# Patient Record
Sex: Male | Born: 1950 | Race: Black or African American | Hispanic: No | Smoking: Former smoker
Health system: Southern US, Community
[De-identification: ages and names within clinical notes are randomized; demographics above are authoritative.]

## PROBLEM LIST (undated history)

## (undated) DIAGNOSIS — I10 Essential (primary) hypertension: Secondary | ICD-10-CM

## (undated) DIAGNOSIS — K219 Gastro-esophageal reflux disease without esophagitis: Secondary | ICD-10-CM

## (undated) DIAGNOSIS — G35 Multiple sclerosis: Secondary | ICD-10-CM

## (undated) DIAGNOSIS — E119 Type 2 diabetes mellitus without complications: Secondary | ICD-10-CM

## (undated) DIAGNOSIS — G8222 Paraplegia, incomplete: Secondary | ICD-10-CM

## (undated) DIAGNOSIS — E785 Hyperlipidemia, unspecified: Secondary | ICD-10-CM

## (undated) DIAGNOSIS — R972 Elevated prostate specific antigen [PSA]: Principal | ICD-10-CM

## (undated) HISTORY — DX: Hyperlipidemia, unspecified: E78.5

## (undated) HISTORY — DX: Essential (primary) hypertension: I10

## (undated) HISTORY — DX: Gastro-esophageal reflux disease without esophagitis: K21.9

## (undated) HISTORY — DX: Type 2 diabetes mellitus without complications: E11.9

## (undated) HISTORY — DX: Multiple sclerosis: G35

## (undated) HISTORY — DX: Elevated prostate specific antigen (PSA): R97.20

## (undated) HISTORY — PX: OTHER SURGICAL HISTORY: SHX169

## (undated) HISTORY — DX: Paraplegia, incomplete: G82.22

---

## 2001-04-24 ENCOUNTER — Encounter: Payer: Self-pay | Admitting: Neurology

## 2001-04-24 ENCOUNTER — Ambulatory Visit (HOSPITAL_COMMUNITY): Admission: RE | Admit: 2001-04-24 | Discharge: 2001-04-24 | Payer: Self-pay | Admitting: Neurology

## 2004-08-08 ENCOUNTER — Encounter: Admission: RE | Admit: 2004-08-08 | Discharge: 2004-11-06 | Payer: Self-pay | Admitting: Neurology

## 2004-08-25 ENCOUNTER — Emergency Department (HOSPITAL_COMMUNITY): Admission: EM | Admit: 2004-08-25 | Discharge: 2004-08-25 | Payer: Self-pay | Admitting: Emergency Medicine

## 2006-06-09 ENCOUNTER — Ambulatory Visit: Payer: Self-pay | Admitting: Psychiatry

## 2008-12-07 ENCOUNTER — Inpatient Hospital Stay: Payer: Self-pay | Admitting: *Deleted

## 2010-01-17 ENCOUNTER — Emergency Department: Payer: Self-pay | Admitting: Emergency Medicine

## 2010-11-20 ENCOUNTER — Inpatient Hospital Stay: Payer: Self-pay | Admitting: Internal Medicine

## 2014-03-27 ENCOUNTER — Ambulatory Visit: Payer: Self-pay | Admitting: Internal Medicine

## 2014-07-18 DIAGNOSIS — G8222 Paraplegia, incomplete: Secondary | ICD-10-CM

## 2014-07-18 HISTORY — DX: Paraplegia, incomplete: G82.22

## 2016-01-17 ENCOUNTER — Ambulatory Visit: Payer: Self-pay

## 2016-01-25 ENCOUNTER — Encounter: Payer: Self-pay | Admitting: Urology

## 2016-01-25 ENCOUNTER — Ambulatory Visit (INDEPENDENT_AMBULATORY_CARE_PROVIDER_SITE_OTHER): Payer: Medicare Other | Admitting: Urology

## 2016-01-25 VITALS — BP 177/85 | HR 72 | Ht 76.0 in

## 2016-01-25 DIAGNOSIS — N4281 Prostatodynia syndrome: Secondary | ICD-10-CM

## 2016-01-25 DIAGNOSIS — E119 Type 2 diabetes mellitus without complications: Secondary | ICD-10-CM | POA: Insufficient documentation

## 2016-01-25 DIAGNOSIS — N4289 Other specified disorders of prostate: Secondary | ICD-10-CM

## 2016-01-25 DIAGNOSIS — R972 Elevated prostate specific antigen [PSA]: Secondary | ICD-10-CM

## 2016-01-25 DIAGNOSIS — I10 Essential (primary) hypertension: Secondary | ICD-10-CM

## 2016-01-25 DIAGNOSIS — E785 Hyperlipidemia, unspecified: Secondary | ICD-10-CM | POA: Insufficient documentation

## 2016-01-25 DIAGNOSIS — G35 Multiple sclerosis: Secondary | ICD-10-CM

## 2016-01-25 DIAGNOSIS — K219 Gastro-esophageal reflux disease without esophagitis: Secondary | ICD-10-CM

## 2016-01-25 HISTORY — DX: Elevated prostate specific antigen (PSA): R97.20

## 2016-01-25 HISTORY — DX: Essential (primary) hypertension: I10

## 2016-01-25 HISTORY — DX: Gastro-esophageal reflux disease without esophagitis: K21.9

## 2016-01-25 HISTORY — DX: Type 2 diabetes mellitus without complications: E11.9

## 2016-01-25 HISTORY — DX: Hyperlipidemia, unspecified: E78.5

## 2016-01-25 HISTORY — DX: Multiple sclerosis: G35

## 2016-01-25 LAB — URINALYSIS, COMPLETE
Bilirubin, UA: NEGATIVE
GLUCOSE, UA: NEGATIVE
Ketones, UA: NEGATIVE
LEUKOCYTES UA: NEGATIVE
Nitrite, UA: NEGATIVE
PH UA: 6.5 (ref 5.0–7.5)
Specific Gravity, UA: 1.02 (ref 1.005–1.030)
Urobilinogen, Ur: 0.2 mg/dL (ref 0.2–1.0)

## 2016-01-25 LAB — MICROSCOPIC EXAMINATION: BACTERIA UA: NONE SEEN

## 2016-01-25 NOTE — Patient Instructions (Signed)

## 2016-01-25 NOTE — Progress Notes (Signed)
01/25/2016 11:35 AM   Victor Mcgee [birth year] [id number]  Referring provider: Glendon Axe   Chief Complaint  Patient presents with  . Elevated PSA    New Patient    HPI: 65 year old AAM with MS who presents for workup of elevated PSA.  He had a PSA drawn by his PCP on 01/04/2016 which resulted 8.87 ng/dL. He has no known prior history of elevated PSA. He does have a significant family history of prostate cancer including 2 brothers who were diagnosed and treated in their 63s for prostate cancer.  He denies any significant urinary symptoms other than occasional urge incontinence. He is wheelchair bound and has been for many years related to his multiple sclerosis. Occasionally, he has difficulty getting to the bathroom on time due to transfer issues and mobility. These episodes are fairly rare. He denies any baseline urinary urgency, frequency, or nocturia. His stream is good. He does feel that he is able to empty his bladder completely. No UTIs or gross hematuria.  No weight loss or bone pain.  He presents to clinic today accompanied by his son.    PMH: Past Medical History  Diagnosis Date  . BP (high blood pressure) 01/25/2016  . Acid reflux 01/25/2016  . Controlled type 2 diabetes mellitus without complication ([name]) 99991111  . Incomplete paraplegia ([name]) 07/18/2014  . Multiple sclerosis ([name]) 01/25/2016  . Elevated prostate specific antigen (PSA) 01/25/2016  . HLD (hyperlipidemia) 01/25/2016    Surgical History: Past Surgical History  Procedure Laterality Date  . None      Home Medications:    Medication List       This list is accurate as of: 01/25/16 11:35 AM.  Always use your most recent med list.               baclofen 10 MG tablet  Commonly known as:  LIORESAL  Take by mouth.     hydrochlorothiazide 12.5 MG capsule  Commonly known as:  MICROZIDE  Take by mouth.     lisinopril 10 MG tablet  Commonly known as:  PRINIVIL,ZESTRIL  Take by mouth.     memantine 10 MG tablet  Commonly known as:  NAMENDA  Take 1 tablet by mouth two  times daily     metoprolol 50 MG tablet  Commonly known as:  LOPRESSOR  Take by mouth.     Transport Chair Misc  One power wheelchair for multiple sclerosis with leg weakness.     ZETIA 10 MG tablet  Generic drug:  ezetimibe  Take by mouth.        Allergies: No Known Allergies  Family History: Family History  Problem Relation Age of Onset  . Kidney failure Mother   . Kidney cancer Brother   . Prostate cancer Brother     Social History:  reports that he has quit smoking. He does not have any smokeless tobacco history on file. He reports that he does not drink alcohol or use illicit drugs.  ROS: UROLOGY Frequent Urination?: No Hard to postpone urination?: No Burning/pain with urination?: No Get up at night to urinate?: No Leakage of urine?: No Urine stream starts and stops?: No Trouble starting stream?: No Do you have to strain to urinate?: No Blood in urine?: No Urinary tract infection?: No Sexually transmitted disease?: No Injury to kidneys or bladder?: No Painful intercourse?: No Weak stream?: No Erection problems?: No Penile pain?: No  Gastrointestinal Nausea?: No Vomiting?: No Indigestion/heartburn?: No Diarrhea?: No Constipation?: No  Constitutional  Fever: No Night sweats?: No Weight loss?: No Fatigue?: No  Skin Skin rash/lesions?: No Itching?: No  Eyes Blurred vision?: No Double vision?: No  Ears/Nose/Throat Sore throat?: No Sinus problems?: No  Hematologic/Lymphatic Swollen glands?: No Easy bruising?: No  Cardiovascular Leg swelling?: No Chest pain?: No  Respiratory Cough?: No Shortness of breath?: No  Endocrine Excessive thirst?: No  Musculoskeletal Back pain?: No Joint pain?: No  Neurological Headaches?: No Dizziness?: No  Psychologic Depression?: No Anxiety?: No  Physical Exam: BP 177/85 mmHg  Pulse 72  Ht 6\' 4"  (1.93 m)    Constitutional:  Alert and oriented, No acute distress. HEENT: [place] AT, moist mucus membranes.  Trachea midline, no masses. Cardiovascular: No clubbing, cyanosis, or edema. Respiratory: Normal respiratory effort, no increased work of breathing. GI: Abdomen is soft, nontender, nondistended, no abdominal masses GU: No CVA tenderness.  Rectal: Slightly decreased sphincter tone. No hemorrhoids. Enlarged 50+ g prostate with slight asymmetry, right greater than left with rubbery lateral lobe. No nodules. Skin: No rashes, bruises or suspicious lesions. Lymph: No cervical or inguinal adenopathy. Neurologic: Lower extremity weakness, able to transfer and stand with support. Psychiatric: Normal mood and affect.  Laboratory Data: PSA, Total (Screen)01/04/2016  [name]  Component Name Value Range  PSA (Prostate Specific Antigen), Total 8.87 (H) 0.10 - 4.00 ng/mL   Cr 1.3  AST 105  ALT 50  A1c 5.7  Hgb 14.3   Urinalysis Results for orders placed or performed in visit on 01/25/16  Microscopic Examination  Result Value Ref Range   WBC, UA 0-5 0 -  5 /hpf   RBC, UA 0-2 0 -  2 /hpf   Epithelial Cells (non renal) 0-10 0 - 10 /hpf   Bacteria, UA None seen None seen/Few  Urinalysis, Complete  Result Value Ref Range   Specific Gravity, UA 1.020 1.005 - 1.030   pH, UA 6.5 5.0 - 7.5   Color, UA Yellow Yellow   Appearance Ur Clear Clear   Leukocytes, UA Negative Negative   Protein, UA 2+ (A) Negative/Trace   Glucose, UA Negative Negative   Ketones, UA Negative Negative   RBC, UA Trace (A) Negative   Bilirubin, UA Negative Negative   Urobilinogen, Ur 0.2 0.2 - 1.0 mg/dL   Nitrite, UA Negative Negative   Microscopic Examination See below:   PSA  Result Value Ref Range   Prostate Specific Ag, Serum 10.4 (H) 0.0 - 4.0 ng/mL     Pertinent Imaging: n/a  Assessment & Plan:    1. Elevated prostate specific antigen (PSA) Elevated PSA, repeat 10.4 with asymmetric  prostate, strong family history of prostate cancer.  He does have medical comorbidities including MS but he is a relatively slow progressor.        We reviewed the implications of an elevated PSA and the uncertainty surrounding it. In general, a man's PSA increases with age and is produced by both normal and cancerous prostate tissue. Differential for elevated PSA is BPH, prostate cancer, infection, recent intercourse/ejaculation, prostate infarction, recent urethroscopic manipulation (foley placement/cystoscopy) and prostatitis. Management of an elevated PSA can include observation or prostate biopsy and wediscussed this in detail. We discussed that indications for prostate biopsy are defined by age and race specific PSA cutoffs as well as a PSA velocity of 0.75/year.  We discussed prostate biopsy in detail including the procedure itself, the risks of blood in the urine, stool, and ejaculate, serious infection, and discomfort.   Given above findings, recommend proceeding  with prostate biopsy.  - Urinalysis, Complete - PSA  2. Prostate asymmetry As above  Recommend prostate biopsy.  Will arrange for this in the near future.   Hollice Espy, MD  [facility] [address], [place], [place] [address] [phone]

## 2016-01-26 LAB — PSA: PROSTATE SPECIFIC AG, SERUM: 10.4 ng/mL — AB (ref 0.0–4.0)

## 2016-01-28 ENCOUNTER — Telehealth: Payer: Self-pay

## 2016-01-28 NOTE — Telephone Encounter (Signed)
-----   Message from Hollice Espy, MD sent at 01/26/2016 11:14 AM EST ----- Please let this patient noted that his PSA is even higher, now 10.4 which is very concerning. As we discussed in the clinic, if his PSA was the same or higher, I would recommend prostate biopsy. This is the case. Please arrange for him to be scheduled for prostate biopsy as discussed.  Hollice Espy, MD

## 2016-01-28 NOTE — Telephone Encounter (Signed)
LMOM

## 2016-01-29 NOTE — Telephone Encounter (Signed)
Spoke with pt and niece in reference to PSA and prostate bx. Pt and niece elected to have a prostate bx. Reviewed instructions with both parties. Both voiced understanding. Also mailed out prostate bx instructions. Pt made aware of mailing information.

## 2016-01-29 NOTE — Telephone Encounter (Signed)
LMOM

## 2016-02-08 ENCOUNTER — Other Ambulatory Visit: Payer: Self-pay | Admitting: Internal Medicine

## 2016-02-08 DIAGNOSIS — R7401 Elevation of levels of liver transaminase levels: Secondary | ICD-10-CM

## 2016-02-08 DIAGNOSIS — R740 Nonspecific elevation of levels of transaminase and lactic acid dehydrogenase [LDH]: Principal | ICD-10-CM

## 2016-02-09 DIAGNOSIS — R740 Nonspecific elevation of levels of transaminase and lactic acid dehydrogenase [LDH]: Secondary | ICD-10-CM

## 2016-02-09 DIAGNOSIS — R7401 Elevation of levels of liver transaminase levels: Secondary | ICD-10-CM | POA: Insufficient documentation

## 2016-02-09 DIAGNOSIS — E7800 Pure hypercholesterolemia, unspecified: Secondary | ICD-10-CM | POA: Insufficient documentation

## 2016-02-09 DIAGNOSIS — I10 Essential (primary) hypertension: Secondary | ICD-10-CM | POA: Insufficient documentation

## 2016-02-15 ENCOUNTER — Ambulatory Visit (INDEPENDENT_AMBULATORY_CARE_PROVIDER_SITE_OTHER): Payer: Medicare Other | Admitting: Urology

## 2016-02-15 ENCOUNTER — Encounter: Payer: Self-pay | Admitting: Urology

## 2016-02-15 ENCOUNTER — Other Ambulatory Visit: Payer: Self-pay | Admitting: Urology

## 2016-02-15 VITALS — BP 133/71 | HR 78 | Ht 76.0 in

## 2016-02-15 DIAGNOSIS — R972 Elevated prostate specific antigen [PSA]: Secondary | ICD-10-CM

## 2016-02-15 DIAGNOSIS — N4281 Prostatodynia syndrome: Secondary | ICD-10-CM

## 2016-02-15 DIAGNOSIS — N4289 Other specified disorders of prostate: Secondary | ICD-10-CM

## 2016-02-15 MED ORDER — LEVOFLOXACIN 500 MG PO TABS
500.0000 mg | ORAL_TABLET | Freq: Once | ORAL | Status: AC
  Administered 2016-02-15: 500 mg via ORAL

## 2016-02-15 MED ORDER — GENTAMICIN SULFATE 40 MG/ML IJ SOLN
80.0000 mg | Freq: Once | INTRAMUSCULAR | Status: AC
  Administered 2016-02-15: 80 mg via INTRAMUSCULAR

## 2016-02-15 NOTE — Progress Notes (Signed)
1:48 PM  02/15/2016   Victor Mcgee 1951/06/23 [id number]  Referring provider: Glendon Axe   Chief Complaint  Patient presents with  . Prostate Biopsy    HPI: 65 year old AAM with MS elevated PSA to 8.87, repeat 10.4 abnormal DRE enlarged 50+ g prostate with slight asymmetry, right greater than left with rubbery lateral lobe who presents today for prostate biopsy.    He does have a significant family history of prostate cancer including 2 brothers who were diagnosed and treated in their 81s for prostate cancer.  He denies any significant urinary symptoms other than occasional urge incontinence. He is wheelchair bound and has been for many years related to his multiple sclerosis. Occasionally, he has difficulty getting to the bathroom on time due to transfer issues and mobility. These episodes are fairly rare. He denies any baseline urinary urgency, frequency, or nocturia. His stream is good. He does feel that he is able to empty his bladder completely. No UTIs or gross hematuria.  No weight loss or bone pain.   PMH: Past Medical History  Diagnosis Date  . BP (high blood pressure) 01/25/2016  . Acid reflux 01/25/2016  . Controlled type 2 diabetes mellitus without complication ([place]) 99991111  . Incomplete paraplegia ([place]) 07/18/2014  . Multiple sclerosis ([place]) 01/25/2016  . Elevated prostate specific antigen (PSA) 01/25/2016  . HLD (hyperlipidemia) 01/25/2016    Surgical History: Past Surgical History  Procedure Laterality Date  . None      Home Medications:    Medication List       This list is accurate as of: 02/15/16  1:48 PM.  Always use your most recent med list.               baclofen 10 MG tablet  Commonly known as:  LIORESAL  Take by mouth.     hydrochlorothiazide 12.5 MG capsule  Commonly known as:  MICROZIDE  Take by mouth.     lisinopril 10 MG tablet  Commonly known as:  PRINIVIL,ZESTRIL  Take by mouth.     memantine 10 MG tablet  Commonly known  as:  NAMENDA  Take 1 tablet by mouth two  times daily     metoprolol 50 MG tablet  Commonly known as:  LOPRESSOR  Take by mouth.     Transport Chair Misc  One power wheelchair for multiple sclerosis with leg weakness.     ZETIA 10 MG tablet  Generic drug:  ezetimibe  Take by mouth.        Allergies: No Known Allergies  Family History: Family History  Problem Relation Age of Onset  . Kidney failure Mother   . Kidney cancer Brother   . Prostate cancer Brother     Social History:  reports that he has quit smoking. He does not have any smokeless tobacco history on file. He reports that he does not drink alcohol or use illicit drugs.   Physical Exam: BP 133/71 mmHg  Pulse 78  Ht 6\' 4"  (1.93 m)  Constitutional:  Alert and oriented, No acute distress. HEENT: [place] AT, moist mucus membranes.  Trachea midline, no masses. Cardiovascular: No clubbing, cyanosis, or edema. Respiratory: Normal respiratory effort, no increased work of breathing. GI: Abdomen is soft, nontender, nondistended, no abdominal masses Skin: No rashes, bruises or suspicious lesions. Lymph: No cervical or inguinal adenopathy. Neurologic: Lower extremity weakness, able to transfer and stand with support. Psychiatric: Normal mood and affect.  Laboratory Data: PSA, Total (Screen)01/04/2016  [facility]  Component Name Value Range  PSA (Prostate Specific Antigen), Total 8.87 (H) 0.10 - 4.00 ng/mL   Cr 1.3  AST 105  ALT 50  A1c 5.7  Hgb 14.3   Prostate Biopsy Procedure   Informed consent was obtained after discussing risks/benefits of the procedure.  A time out was performed to ensure correct patient identity.  Pre-Procedure: - Last PSA Level: 10.4 - Gentamicin given prophylactically - Levaquin 500 mg administered PO -Transrectal Ultrasound performed revealing a 67 gm prostate -No significant hypoechoic or median lobe noted -Spherical nodule noted at the apex, appears to be  consistent with BPH nodule  Procedure: - Prostate block performed using 10 cc 1% lidocaine and biopsies taken from sextant areas, a total of 12 under ultrasound guidance.  Post-Procedure: - Patient did have 2 short vasovagal episodes but recovered well after lying supine for period of time and having a snack. His respirations, pulse, blood pressure remained stable.  He did become diaphoretic at the time of a vasovagal episode. - He was counseled to seek immediate medical attention if experiences any severe pain, significant bleeding, or fevers - Return in one week to discuss biopsy results   Assessment & Plan:    1. Elevated prostate specific antigen (PSA) S/p biopsy  2. Prostate asymmetry As above  Follow-up scheduled for March 8th to review pathology results   Hollice Espy, MD  [facility] [address] [place],  [address] [phone]

## 2016-02-21 LAB — PATHOLOGY REPORT

## 2016-02-22 ENCOUNTER — Ambulatory Visit
Admission: RE | Admit: 2016-02-22 | Discharge: 2016-02-22 | Disposition: A | Discharge status: Home / Self Care | Payer: Medicare Other | Source: Ambulatory Visit | Attending: Internal Medicine | Admitting: Internal Medicine

## 2016-02-22 DIAGNOSIS — K760 Fatty (change of) liver, not elsewhere classified: Secondary | ICD-10-CM | POA: Insufficient documentation

## 2016-02-22 DIAGNOSIS — R740 Nonspecific elevation of levels of transaminase and lactic acid dehydrogenase [LDH]: Secondary | ICD-10-CM | POA: Diagnosis not present

## 2016-02-22 DIAGNOSIS — R7401 Elevation of levels of liver transaminase levels: Secondary | ICD-10-CM

## 2016-02-27 ENCOUNTER — Other Ambulatory Visit: Payer: Self-pay | Admitting: Urology

## 2016-02-27 ENCOUNTER — Encounter: Payer: Self-pay | Admitting: Urology

## 2016-02-27 ENCOUNTER — Ambulatory Visit (INDEPENDENT_AMBULATORY_CARE_PROVIDER_SITE_OTHER): Payer: Medicare Other | Admitting: Urology

## 2016-02-27 VITALS — BP 127/74 | HR 62 | Ht 77.0 in

## 2016-02-27 DIAGNOSIS — C61 Malignant neoplasm of prostate: Secondary | ICD-10-CM

## 2016-02-27 DIAGNOSIS — N3941 Urge incontinence: Secondary | ICD-10-CM | POA: Diagnosis not present

## 2016-02-27 DIAGNOSIS — N528 Other male erectile dysfunction: Secondary | ICD-10-CM | POA: Diagnosis not present

## 2016-02-27 NOTE — Progress Notes (Signed)
7:19 PM  02/28/2016   Victor Mcgee Apr 15, 1951 [id number]  Referring provider: Glendon Axe   Chief Complaint  Patient presents with  . Results    Biopsy Results  . Prostate Cancer    HPI: 65 year old AAM with MS with elevated PSA to 8.87 (repeat 10.4) ng/dL, family history of prostate cancer including 2 brothers, and prostatic asymmetry R>L s/p prostate biopsy on 2/24 with pathology c/w Gleason 3+3 involving 6/12 cores bilaterally ranging from 1-44%.  TRUS volume 67 cc  He denies any significant urinary symptoms other than occasional urge incontinence. He is wheelchair bound and has been for many years related to his multiple sclerosis. Occasionally, he has difficulty getting to the bathroom on time due to transfer issues and mobility. These episodes are fairly rare. He denies any baseline urinary urgency, frequency, or nocturia. His stream is good. He does feel that he is able to empty his bladder completely. No UTIs or gross hematuria.  He is not sexually active, SHIM incomplete as much is N/A.    No weight loss or bone pain.      IPSS      02/27/16 1100       International Prostate Symptom Score   How often have you had the sensation of not emptying your bladder? Not at All     How often have you had to urinate less than every two hours? Less than 1 in 5 times     How often have you found you stopped and started again several times when you urinated? Not at All     How often have you found it difficult to postpone urination? Less than half the time     How often have you had a weak urinary stream? Less than 1 in 5 times     How often have you had to strain to start urination? Not at All     How many times did you typically get up at night to urinate? 1 Time     Total IPSS Score 5     Quality of Life due to urinary symptoms   If you were to spend the rest of your life with your urinary condition just the way it is now how would you feel about that? Mixed         Score:  1-7 Mild 8-19 Moderate 20-35 Severe       SHIM      02/27/16 1120       SHIM: Over the last 6 months:   How do you rate your confidence that you could get and keep an erection? Low     When you had erections with sexual stimulation, how often were your erections hard enough for penetration (entering your partner)? A Few Times (much less than half the time)     SHIM Total Score   SHIM 4          PMH: Past Medical History  Diagnosis Date  . BP (high blood pressure) 01/25/2016  . Acid reflux 01/25/2016  . Controlled type 2 diabetes mellitus without complication ([place]) 99991111  . Incomplete paraplegia ([place]) 07/18/2014  . Multiple sclerosis ([place]) 01/25/2016  . Elevated prostate specific antigen (PSA) 01/25/2016  . HLD (hyperlipidemia) 01/25/2016    Surgical History: Past Surgical History  Procedure Laterality Date  . None      Home Medications:    Medication List       This list is accurate as of: 02/27/16 11:59 PM.  Always use your most recent med list.               baclofen 10 MG tablet  Commonly known as:  LIORESAL  Take by mouth.     hydrochlorothiazide 12.5 MG capsule  Commonly known as:  MICROZIDE  Take by mouth.     lisinopril 10 MG tablet  Commonly known as:  PRINIVIL,ZESTRIL  Take by mouth.     memantine 10 MG tablet  Commonly known as:  NAMENDA  Take 1 tablet by mouth two  times daily     metoprolol 50 MG tablet  Commonly known as:  LOPRESSOR  Take by mouth.     Transport Chair Misc  One power wheelchair for multiple sclerosis with leg weakness.     ZETIA 10 MG tablet  Generic drug:  ezetimibe  Take by mouth.        Allergies: No Known Allergies  Family History: Family History  Problem Relation Age of Onset  . Kidney failure Mother   . Kidney cancer Brother   . Prostate cancer Brother     Social History:  reports that he has quit smoking. He does not have any smokeless tobacco history on file. He reports that he does  not drink alcohol or use illicit drugs.   Physical Exam: BP 127/74 mmHg  Pulse 62  Ht 6\' 5"  (1.956 m)  Constitutional:  Alert and oriented, No acute distress.  In wheel chair.  Accompanied by his sister and nephew today. HEENT: [place] AT, moist mucus membranes.  Trachea midline, no masses. Cardiovascular: No clubbing, cyanosis, or edema. Respiratory: Normal respiratory effort, no increased work of breathing. Skin: No rashes, bruises or suspicious lesions. Neurologic: Lower extremity weakness, able to transfer and stand with support. Psychiatric: Normal mood and affect.  Laboratory Data: PSA, Total (Screen)01/04/2016  [facility]  Component Name Value Range  PSA (Prostate Specific Antigen), Total 8.87 (H) 0.10 - 4.00 ng/mL   Cr 1.3  AST 105  ALT 50  A1c 5.7  Hgb 14.3  PNBX 02/15/16  Comment   Comments: Material submitted:                    Marland Kitchen  PART A: Prostate CNB, Right, Apex  PART B: Prostate CNB, Right, Mid  PART C: Prostate CNB, Right, Base  PART D: Prostate CNB, Right, Lateral Apex  PART E: Prostate CNB, Right, Lateral Mid  PART F: Prostate CNB, Right, Lateral Base  PART G: Prostate CNB, Left, Apex  PART H: Prostate CNB, Left, Mid  PART I: Prostate CNB, Left, Base  PART J: Prostate CNB, Left, Lateral Apex  PART K: Prostate CNB, Left, Lateral Mid  PART L: Prostate CNB, Left, Lateral Base     . Comment   Comments: Clinical history:                     .  PSA 10.4     . Comment   Comments A. PROSTATIC ADENOCARCINOMA. GLEASON'S SCORE 6 (GRADES 3 + 3) NOTED IN 1  OUT OF 1 SUBMITTED PROSTATE CORE SEGMENTS. APPROXIMATELY 42% OF SUBMITTED  TISSUE INVOLVED... SEE COMMENT.  B. BENIGN PROSTATE TISSUE. NO EVIDENCE OF MALIGNANCY.  C. BENIGN PROSTATE TISSUE. NO EVIDENCE OF MALIGNANCY.  D. PROSTATIC ADENOCARCINOMA. GLEASON'S SCORE 6 (GRADES 3 + 3) NOTED IN 1  OUT OF 2 SUBMITTED PROSTATE CORE SEGMENTS.  APPROXIMATELY 44% OF SUBMITTED  TISSUE INVOLVED.Marland Kitchen. (GRADE GROUP 4)  E. PROSTATIC  ADENOCARCINOMA. GLEASON'S SCORE 6 (GRADES 3 + 3) NOTED IN 1  OUT OF 1 SUBMITTED PROSTATE CORE SEGMENTS. APPROXIMATELY 1% OF SUBMITTED  TISSUE INVOLVED...  F. BENIGN PROSTATE TISSUE. NO EVIDENCE OF MALIGNANCY.  G. BENIGN PROSTATE TISSUE. NO EVIDENCE OF MALIGNANCY.  H. PROSTATIC ADENOCARCINOMA. GLEASON'S SCORE 6 (GRADES 3 + 3) NOTED IN 1  OUT OF 1 SUBMITTED PROSTATE CORE SEGMENTS. APPROXIMATELY 1% OF SUBMITTED  TISSUE INVOLVED...  I. BENIGN PROSTATE TISSUE. NO EVIDENCE OF MALIGNANCY.  J. BENIGN PROSTATE TISSUE. NO EVIDENCE OF MALIGNANCY.  K. PROSTATIC ADENOCARCINOMA. GLEASON'S SCORE 6 (GRADES 3 + 3) NOTED IN 1  OUT OF 1 SUBMITTED PROSTATE CORE SEGMENTS. APPROXIMATELY 12% OF SUBMITTED  TISSUE INVOLVED... SEE COMMENT.  L. PROSTATIC ADENOCARCINOMA. GLEASON'S SCORE 6 (GRADES 3 + 3) NOTED IN 1  OUT OF 1 SUBMITTED PROSTATE CORE SEGMENTS. APPROXIMATELY 1% OF SUBMITTED  TISSUE INVOLVED.Marland KitchenMarland Kitchen           Pertinent Imaging: n/a  Assessment & Plan:    1. Prostate cancer [facility]) The patient was counseled about the natural history of prostate cancer and the standard treatment options that are available for prostate cancer. It was explained to him how his age and life expectancy, clinical stage, Gleason score, and PSA affect his prognosis, the decision to proceed with additional staging studies, as well as how that information influences recommended treatment strategies. We discussed the roles for active surveillance, radiation therapy, surgical therapy, androgen deprivation, as well as ablative therapy options for the treatment of prostate cancer as appropriate to his individual cancer situation. We discussed the risks and benefits of these options with regard to their impact on cancer control and also in terms of potential adverse events, complications, and impact on quality of life particularly related to urinary,  bowel, and sexual function. The patient was encouraged to ask questions throughout the discussion today and all questions were answered to his stated satisfaction. In addition, the patient was provided with and/or directed to appropriate resources and literature for further education about prostate cancer treatment options.  He was given a copy of his pathology results today along with a book "100 questions".  Based on his initial PSA and Gleason score, he is considered low risk. As as such, all treatment options are available to him. He is a reasonable candidate for active surveillance although I would recommend very close follow-up given his family history of prostate cancer, asymmetric prostate exam, in PSA which is borderline. Given his history of MS, I am somewhat concerned that he have a severe issue with urinary incontinence which we discussed at length today if he elected to proceed with surgical intervention.  We also discussed possible implantation of brachytherapy seeds versus external beam radiation. After lengthy discussion, he is most interested in active surveillance at this time.  Recommend follow-up in 4 months with PSA/DRE. I have also recommended repeat prostate biopsy if his PSA begins to rise, rectal exam changes, and/or a confirmatory biopsy at approximately 8-12 months.  Stress the importance of very close follow-up. He is agreeable with this.  2. Urge incontinence Rare.  Minimal bother.  3. Other male erectile dysfunction Not sexually active therefore minimal bother.  RTC 4 months for PSA/ DRE.  Hollice Espy, MD  [facility] [address] [place], [place] [address] [phone]  I spent 30 min with this patient of which greater than 50% was spent in counseling and coordination of care with the patient.

## 2016-07-04 ENCOUNTER — Ambulatory Visit: Payer: Medicare Other | Admitting: Urology

## 2016-07-11 ENCOUNTER — Encounter: Payer: Self-pay | Admitting: Urology

## 2016-07-11 ENCOUNTER — Ambulatory Visit (INDEPENDENT_AMBULATORY_CARE_PROVIDER_SITE_OTHER): Payer: Medicare Other | Admitting: Urology

## 2016-07-11 VITALS — BP 135/78 | HR 56 | Ht 76.0 in

## 2016-07-11 DIAGNOSIS — C61 Malignant neoplasm of prostate: Secondary | ICD-10-CM | POA: Diagnosis not present

## 2016-07-11 DIAGNOSIS — N528 Other male erectile dysfunction: Secondary | ICD-10-CM | POA: Diagnosis not present

## 2016-07-11 DIAGNOSIS — N3941 Urge incontinence: Secondary | ICD-10-CM | POA: Diagnosis not present

## 2016-07-11 NOTE — Progress Notes (Signed)
11:46 AM  07/11/2016   Victor Mcgee April 16, 1951 [id number]  Referring provider: Glendon Axe   Chief Complaint  Patient presents with  . Prostate Cancer    41month    HPI: 65 year old AAM with MS with elevated PSA to 8.87 (repeat 10.4) ng/dL, family history of prostate cancer including 2 brothers, and prostatic asymmetry R>L s/p prostate biopsy on 2/24 with pathology c/w Gleason 3+3 involving 6/12 cores bilaterally ranging from 1-44%.  TRUS volume 67 cc.    He denies any significant urinary symptoms other than occasional urge incontinence. He is wheelchair bound and has been for many years related to his multiple sclerosis. Occasionally, he has difficulty getting to the bathroom on time due to transfer issues and mobility. These episodes are fairly rare. He denies any baseline urinary urgency, frequency, or nocturia. His stream is good. He does feel that he is able to empty his bladder completely. No UTIs or gross hematuria.  He is not sexually active.    No weight loss or bone pain.   He returns today for PSA/ DRE as per surveillance protocol.   PMH: Past Medical History  Diagnosis Date  . BP (high blood pressure) 01/25/2016  . Acid reflux 01/25/2016  . Controlled type 2 diabetes mellitus without complication ([place]) 99991111  . Incomplete paraplegia ([place]) 07/18/2014  . Multiple sclerosis ([place]) 01/25/2016  . Elevated prostate specific antigen (PSA) 01/25/2016  . HLD (hyperlipidemia) 01/25/2016    Surgical History: Past Surgical History  Procedure Laterality Date  . None      Home Medications:    Medication List       This list is accurate as of: 07/11/16 11:46 AM.  Always use your most recent med list.               baclofen 10 MG tablet  Commonly known as:  LIORESAL  Take by mouth.     hydrochlorothiazide 12.5 MG capsule  Commonly known as:  MICROZIDE  Take by mouth.     lisinopril 10 MG tablet  Commonly known as:  PRINIVIL,ZESTRIL  Take by mouth.     memantine 10 MG tablet  Commonly known as:  NAMENDA  Take 1 tablet by mouth two  times daily     metoprolol 50 MG tablet  Commonly known as:  LOPRESSOR  Take by mouth.     Transport Chair Misc  One power wheelchair for multiple sclerosis with leg weakness.     ZETIA 10 MG tablet  Generic drug:  ezetimibe  Take by mouth.        Allergies: No Known Allergies  Family History: Family History  Problem Relation Age of Onset  . Kidney failure Mother   . Kidney cancer Brother   . Prostate cancer Brother     Social History:  reports that he has quit smoking. He does not have any smokeless tobacco history on file. He reports that he does not drink alcohol or use illicit drugs.   Physical Exam: BP 135/78 mmHg  Pulse 56  Ht 6\' 4"  (1.93 m)  Constitutional:  Alert and oriented, No acute distress.  In wheel chair.  Accompanied by his nephew today. HEENT: [place] AT, moist mucus membranes.  Trachea midline, no masses. Cardiovascular: No clubbing, cyanosis, or edema. Respiratory: Normal respiratory effort, no increased work of breathing. Rectal: Slightly decreased sphincter tone. No hemorrhoids. Enlarged 50+ g prostate with slight asymmetry, right greater than left with rubbery lateral lobe. No nodules.  Stable from previous  exam. Skin: No rashes, bruises or suspicious lesions. Neurologic: Lower extremity weakness, able to transfer and stand with support. Psychiatric: Normal mood and affect.  Laboratory Data: PSA, Total (Screen)01/04/2016  [place]  Component Name Value Range  PSA (Prostate Specific Antigen), Total 8.87 (H) 0.10 - 4.00 ng/mL   Cr 1.3  AST 105  ALT 50  A1c 5.7  Hgb 14.3  PNBX 02/15/16  Comment   Comments: Material submitted:                    Marland Kitchen  PART A: Prostate CNB, Right, Apex  PART B: Prostate CNB, Right, Mid  PART C: Prostate CNB, Right, Base  PART D: Prostate CNB, Right, Lateral Apex  PART E: Prostate CNB,  Right, Lateral Mid  PART F: Prostate CNB, Right, Lateral Base  PART G: Prostate CNB, Left, Apex  PART H: Prostate CNB, Left, Mid  PART I: Prostate CNB, Left, Base  PART J: Prostate CNB, Left, Lateral Apex  PART K: Prostate CNB, Left, Lateral Mid  PART L: Prostate CNB, Left, Lateral Base     . Comment   Comments: Clinical history:                     .  PSA 10.4     . Comment   Comments A. PROSTATIC ADENOCARCINOMA. GLEASON'S SCORE 6 (GRADES 3 + 3) NOTED IN 1  OUT OF 1 SUBMITTED PROSTATE CORE SEGMENTS. APPROXIMATELY 42% OF SUBMITTED  TISSUE INVOLVED... SEE COMMENT.  B. BENIGN PROSTATE TISSUE. NO EVIDENCE OF MALIGNANCY.  C. BENIGN PROSTATE TISSUE. NO EVIDENCE OF MALIGNANCY.  D. PROSTATIC ADENOCARCINOMA. GLEASON'S SCORE 6 (GRADES 3 + 3) NOTED IN 1  OUT OF 2 SUBMITTED PROSTATE CORE SEGMENTS. APPROXIMATELY 44% OF SUBMITTED  TISSUE INVOLVED.Marland Kitchen. (GRADE GROUP 4)  E. PROSTATIC ADENOCARCINOMA. GLEASON'S SCORE 6 (GRADES 3 + 3) NOTED IN 1  OUT OF 1 SUBMITTED PROSTATE CORE SEGMENTS. APPROXIMATELY 1% OF SUBMITTED  TISSUE INVOLVED...  F. BENIGN PROSTATE TISSUE. NO EVIDENCE OF MALIGNANCY.  G. BENIGN PROSTATE TISSUE. NO EVIDENCE OF MALIGNANCY.  H. PROSTATIC ADENOCARCINOMA. GLEASON'S SCORE 6 (GRADES 3 + 3) NOTED IN 1  OUT OF 1 SUBMITTED PROSTATE CORE SEGMENTS. APPROXIMATELY 1% OF SUBMITTED  TISSUE INVOLVED...  I. BENIGN PROSTATE TISSUE. NO EVIDENCE OF MALIGNANCY.  J. BENIGN PROSTATE TISSUE. NO EVIDENCE OF MALIGNANCY.  K. PROSTATIC ADENOCARCINOMA. GLEASON'S SCORE 6 (GRADES 3 + 3) NOTED IN 1  OUT OF 1 SUBMITTED PROSTATE CORE SEGMENTS. APPROXIMATELY 12% OF SUBMITTED  TISSUE INVOLVED... SEE COMMENT.  L. PROSTATIC ADENOCARCINOMA. GLEASON'S SCORE 6 (GRADES 3 + 3) NOTED IN 1  OUT OF 1 SUBMITTED PROSTATE CORE SEGMENTS. APPROXIMATELY 1% OF SUBMITTED  TISSUE INVOLVED...          Assessment & Plan:    1. Prostate cancer ([place]) Low risk prostate cancer on Active  surveillance.  PSA drawn today, rectal exam stable.  Recommend follow-up in 4 months with PSA/DRE. I have also recommended repeat prostate biopsy if his PSA begins to rise, rectal exam changes, and/or a confirmatory biopsy at approximately 8-12 months.  We also discussed that given his vasovagal episode at the time of initial biopsy, we may consider endorectal coil MRI as an alternative to repeat biopsy.  2. Urge incontinence Rare.  Minimal bother.  3. Other male erectile dysfunction Not sexually active therefore minimal bother.  RTC 4 months for PSA/ DRE.  Hollice Espy, MD  [facility] [address], [address]  [place], [place] [address] [phone]

## 2016-07-12 ENCOUNTER — Encounter: Payer: Self-pay | Admitting: Urology

## 2016-07-12 LAB — PSA: Prostate Specific Ag, Serum: 8.8 ng/mL — ABNORMAL HIGH (ref 0.0–4.0)

## 2016-07-14 ENCOUNTER — Telehealth: Payer: Self-pay | Admitting: Urology

## 2016-07-14 NOTE — Telephone Encounter (Signed)
I think this should have come to you to contact the patient not me.   Thanks,  Sharyn Lull

## 2016-07-14 NOTE — Telephone Encounter (Signed)
Spoke with pt in reference to PSA results. Pt voiced understanding.  

## 2016-07-14 NOTE — Telephone Encounter (Signed)
-----   Message from Hollice Espy, MD sent at 07/12/2016  1:20 PM EDT ----- Please let Mr. Bate noted that his PSA was down to 8.8 from 10.4. This is good news. We will continue to follow closely as planned.  Hollice Espy, MD

## 2016-07-25 ENCOUNTER — Ambulatory Visit: Payer: Medicare Other | Admitting: Urology

## 2016-11-12 ENCOUNTER — Other Ambulatory Visit: Payer: Medicare Other

## 2016-11-12 DIAGNOSIS — C61 Malignant neoplasm of prostate: Secondary | ICD-10-CM

## 2016-11-13 LAB — PSA: Prostate Specific Ag, Serum: 9.9 ng/mL — ABNORMAL HIGH (ref 0.0–4.0)

## 2016-11-19 ENCOUNTER — Ambulatory Visit (INDEPENDENT_AMBULATORY_CARE_PROVIDER_SITE_OTHER): Payer: Medicare Other | Admitting: Urology

## 2016-11-19 VITALS — Ht 74.0 in

## 2016-11-19 DIAGNOSIS — N3941 Urge incontinence: Secondary | ICD-10-CM | POA: Diagnosis not present

## 2016-11-19 DIAGNOSIS — C61 Malignant neoplasm of prostate: Secondary | ICD-10-CM

## 2016-11-19 DIAGNOSIS — N528 Other male erectile dysfunction: Secondary | ICD-10-CM | POA: Diagnosis not present

## 2016-11-19 NOTE — Progress Notes (Signed)
10:58 AM  11/19/16   Victor Mcgee 01/13/1951 NP:[id number]  Referring provider: Glendon Axe   Chief Complaint  Patient presents with  . Prostate Cancer    64month    HPI: 65 year old AAM with MS with elevated PSA to 8.87 (repeat 10.4) ng/dL, family history of prostate cancer including 2 brothers, and prostatic asymmetry R>L s/p prostate biopsy on 2/24 with pathology c/w Gleason 3+3 involving 6/12 cores bilaterally ranging from 1-44%.  TRUS volume 67 cc.    He denies any significant urinary symptoms other than occasional urge incontinence. He is wheelchair bound and has been for many years related to his multiple sclerosis. Occasionally, he has difficulty getting to the bathroom on time due to transfer issues and mobility. These episodes are fairly rare. He denies any baseline urinary urgency, frequency, or nocturia. His stream is good. He does feel that he is able to empty his bladder completely. No UTIs or gross hematuria.  He is not sexually active.    No weight loss or bone pain.   He returns today for PSA as per surveillance protocol.   PMH: Past Medical History:  Diagnosis Date  . Acid reflux 01/25/2016  . BP (high blood pressure) 01/25/2016  . Controlled type 2 diabetes mellitus without complication ([place]) 99991111  . Elevated prostate specific antigen (PSA) 01/25/2016  . HLD (hyperlipidemia) 01/25/2016  . Incomplete paraplegia ([place]) 07/18/2014  . Multiple sclerosis ([place]) 01/25/2016    Surgical History: Past Surgical History:  Procedure Laterality Date  . none      Home Medications:    Medication List       Accurate as of 11/19/16 10:58 AM. Always use your most recent med list.          baclofen 10 MG tablet Commonly known as:  LIORESAL Take by mouth.   hydrochlorothiazide 12.5 MG capsule Commonly known as:  MICROZIDE Take by mouth.   lisinopril 10 MG tablet Commonly known as:  PRINIVIL,ZESTRIL Take by mouth.   memantine 10 MG tablet Commonly known as:   NAMENDA Take 1 tablet by mouth two  times daily   metoprolol 50 MG tablet Commonly known as:  LOPRESSOR Take by mouth.   tiZANidine 4 MG capsule Commonly known as:  ZANAFLEX Take 4 mg by mouth 3 (three) times daily.   Transport Chair Misc One power wheelchair for multiple sclerosis with leg weakness.   ZETIA 10 MG tablet Generic drug:  ezetimibe Take by mouth.       Allergies: No Known Allergies  Family History: Family History  Problem Relation Age of Onset  . Kidney failure Mother   . Kidney cancer Brother   . Prostate cancer Brother     Social History:  reports that he has quit smoking. He does not have any smokeless tobacco history on file. He reports that he does not drink alcohol or use drugs.   Physical Exam: Ht 6\' 2"  (1.88 m)   Constitutional:  Alert and oriented, No acute distress.  In wheel chair.  Accompanied by his nephew today. HEENT: [place] AT, moist mucus membranes.  Trachea midline, no masses. Cardiovascular: No clubbing, cyanosis, or edema. Respiratory: Normal respiratory effort, no increased work of breathing. Skin: No rashes, bruises or suspicious lesions. Neurologic: Lower extremity weakness, able to transfer and stand with support. Psychiatric: Normal mood and affect.  Laboratory Data: Component     Latest Ref Rng & Units 01/25/2016 07/11/2016 11/12/2016  PSA     0.0 - 4.0 ng/mL 10.4 (  H) 8.8 (H) 9.9 (H)   Cr 1.3  Assessment & Plan:    1. Prostate cancer ([place]) Low risk prostate cancer on Active surveillance.  Overall, his PSA remained stable. Rectal exam last visit was also stable.  Patient was initially diagnosed in February/2017. At this point, he will be due for either endorectal MRI versus prostate biopsy. Given his episode of previous vasovagal episode in our office, I would recommend proceeding with this in the operating room. He is agreeable with this plan. Reviewed risk of anesthesia, can be performed under monitored anesthesia  care.  Prebiopsy instructions were reviewed today with the patient.  We discussed prostate biopsy in detail including the procedure itself, the risks of blood in the urine, stool, and ejaculate, serious infection, and discomfort. He is willing to proceed with this as discussed.  Plan for prostate biopsy in the operating room in February 2018, rectal exam at that time.    2. Urge incontinence Rare.  Minimal bother.  3. Other male erectile dysfunction Not sexually active therefore minimal bother.   Hollice Espy, MD  [facility] [address] [place], [place] [address] [phone]  [name] to help coordinate procedure

## 2016-12-03 ENCOUNTER — Other Ambulatory Visit: Payer: Self-pay | Admitting: Radiology

## 2016-12-03 DIAGNOSIS — C61 Malignant neoplasm of prostate: Secondary | ICD-10-CM

## 2016-12-29 ENCOUNTER — Other Ambulatory Visit: Payer: Self-pay | Admitting: Radiology

## 2016-12-29 ENCOUNTER — Telehealth: Payer: Self-pay | Admitting: Radiology

## 2016-12-29 NOTE — Telephone Encounter (Signed)
Notified pt's sister, [name], of surgery scheduled with Dr Erlene Quan on 02/04/17, pre-admit testing appt on 01/28/17 @11 :00 & to call day prior to surgery for arrival time to SDS. Rose voices understanding. Information was also mailed to sister's address: [address] [place], [place] [id number]

## 2017-01-23 ENCOUNTER — Other Ambulatory Visit: Payer: Medicare Other

## 2017-01-28 ENCOUNTER — Encounter
Admission: RE | Admit: 2017-01-28 | Discharge: 2017-01-28 | Disposition: A | Discharge status: Home / Self Care | Payer: Medicare Other | Source: Ambulatory Visit | Attending: Urology | Admitting: Urology

## 2017-01-28 DIAGNOSIS — Z01810 Encounter for preprocedural cardiovascular examination: Secondary | ICD-10-CM | POA: Insufficient documentation

## 2017-01-28 DIAGNOSIS — Z01812 Encounter for preprocedural laboratory examination: Secondary | ICD-10-CM | POA: Insufficient documentation

## 2017-01-28 LAB — CBC
HEMATOCRIT: 44.9 % (ref 40.0–52.0)
HEMOGLOBIN: 15.3 g/dL (ref 13.0–18.0)
MCH: 30.6 pg (ref 26.0–34.0)
MCHC: 34 g/dL (ref 32.0–36.0)
MCV: 89.8 fL (ref 80.0–100.0)
Platelets: 265 10*3/uL (ref 150–440)
RBC: 5 MIL/uL (ref 4.40–5.90)
RDW: 13.6 % (ref 11.5–14.5)
WBC: 7.5 10*3/uL (ref 3.8–10.6)

## 2017-01-28 LAB — BASIC METABOLIC PANEL
ANION GAP: 7 (ref 5–15)
BUN: 16 mg/dL (ref 6–20)
CALCIUM: 9.4 mg/dL (ref 8.9–10.3)
CO2: 27 mmol/L (ref 22–32)
Chloride: 106 mmol/L (ref 101–111)
Creatinine, Ser: 1.23 mg/dL (ref 0.61–1.24)
GLUCOSE: 86 mg/dL (ref 65–99)
POTASSIUM: 4.1 mmol/L (ref 3.5–5.1)
Sodium: 140 mmol/L (ref 135–145)

## 2017-01-28 NOTE — Patient Instructions (Signed)
Your procedure is scheduled on: Wednesday 02/04/17 Report to [facility]. 2ND FLOOR MEDICAL MALL ENTRANCE. To find out your arrival time please call [phone] between 1PM - 3PM on Tuesday 02/03/17.  Remember: Instructions that are not followed completely may result in serious medical risk, up to and including death, or upon the discretion of your surgeon and anesthesiologist your surgery may need to be rescheduled.    __X__ 1. Do not eat food or drink liquids after midnight. No gum chewing or hard candies.     __X__ 2. No Alcohol for 24 hours before or after surgery.   ____ 3. Bring all medications with you on the day of surgery if instructed.    __X__ 4. Notify your doctor if there is any change in your medical condition     (cold, fever, infections).             ___X__5. No smoking within 24 hours of your surgery.     Do not wear jewelry, make-up, hairpins, clips or nail polish.  Do not wear lotions, powders, or perfumes.   Do not shave 48 hours prior to surgery. Men may shave face and neck.  Do not bring valuables to the hospital.    [facility] is not responsible for any belongings or valuables.               Contacts, dentures or bridgework may not be worn into surgery.  Leave your suitcase in the car. After surgery it may be brought to your room.  For patients admitted to the hospital, discharge time is determined by your                treatment team.   Patients discharged the day of surgery will not be allowed to drive home.   Please read over the following fact sheets that you were given:   Pain Booklet and MRSA Information   __X__ Take these medicines the morning of surgery with A SIP OF WATER:    1. MEMANTINE  2. METOPROLOL  3.   4.  5.  6.  ____ Fleet Enema (as directed)   ____ Use CHG Soap as directed  ____ Use inhalers on the day of surgery  ____ Stop metformin 2 days prior to surgery    ____ Take 1/2 of usual insulin dose the night before surgery and  none on the morning of surgery.   ____ Stop Coumadin/Plavix/aspirin on   __X__ Stop Anti-inflammatories such as Advil, Aleve, Ibuprofen, Motrin, Naproxen, Naprosyn, Goodies,powder, or aspirin products.  OK to take Tylenol.   ____ Stop supplements until after surgery.    ____ Bring C-Pap to the hospital.

## 2017-01-28 NOTE — Pre-Procedure Instructions (Signed)
EKG COMPARED WITH EKG 2011

## 2017-01-30 NOTE — Pre-Procedure Instructions (Signed)
H&P out of date (11/19/16) and also needs heart & lung assessment.  Need new H&P.   Date of surgery 02/04/17.

## 2017-02-04 ENCOUNTER — Encounter
Admission: RE | Disposition: A | Discharge status: Home / Self Care | Payer: Self-pay | Source: Ambulatory Visit | Attending: Urology

## 2017-02-04 ENCOUNTER — Ambulatory Visit
Admission: RE | Admit: 2017-02-04 | Discharge: 2017-02-04 | Disposition: A | Discharge status: Home / Self Care | Payer: Medicare Other | Source: Ambulatory Visit | Attending: Urology | Admitting: Urology

## 2017-02-04 ENCOUNTER — Ambulatory Visit: Payer: Medicare Other | Admitting: Registered Nurse

## 2017-02-04 ENCOUNTER — Encounter: Payer: Self-pay | Admitting: *Deleted

## 2017-02-04 DIAGNOSIS — J449 Chronic obstructive pulmonary disease, unspecified: Secondary | ICD-10-CM | POA: Insufficient documentation

## 2017-02-04 DIAGNOSIS — I10 Essential (primary) hypertension: Secondary | ICD-10-CM | POA: Diagnosis not present

## 2017-02-04 DIAGNOSIS — G35 Multiple sclerosis: Secondary | ICD-10-CM | POA: Diagnosis not present

## 2017-02-04 DIAGNOSIS — E119 Type 2 diabetes mellitus without complications: Secondary | ICD-10-CM | POA: Diagnosis not present

## 2017-02-04 DIAGNOSIS — K219 Gastro-esophageal reflux disease without esophagitis: Secondary | ICD-10-CM | POA: Diagnosis not present

## 2017-02-04 DIAGNOSIS — C61 Malignant neoplasm of prostate: Secondary | ICD-10-CM | POA: Insufficient documentation

## 2017-02-04 DIAGNOSIS — N400 Enlarged prostate without lower urinary tract symptoms: Secondary | ICD-10-CM

## 2017-02-04 DIAGNOSIS — Z87891 Personal history of nicotine dependence: Secondary | ICD-10-CM | POA: Diagnosis not present

## 2017-02-04 DIAGNOSIS — Z79899 Other long term (current) drug therapy: Secondary | ICD-10-CM | POA: Insufficient documentation

## 2017-02-04 HISTORY — PX: PROSTATE BIOPSY: SHX241

## 2017-02-04 SURGERY — BIOPSY, PROSTATE
Anesthesia: Monitor Anesthesia Care | Site: Prostate | Wound class: Clean

## 2017-02-04 MED ORDER — PHENYLEPHRINE HCL 10 MG/ML IJ SOLN
INTRAMUSCULAR | Status: DC | PRN
  Administered 2017-02-04: 200 ug via INTRAVENOUS
  Administered 2017-02-04: 300 ug via INTRAVENOUS

## 2017-02-04 MED ORDER — MIDAZOLAM HCL 2 MG/2ML IJ SOLN
INTRAMUSCULAR | Status: DC | PRN
  Administered 2017-02-04: 2 mg via INTRAVENOUS

## 2017-02-04 MED ORDER — LIDOCAINE HCL (PF) 2 % IJ SOLN
INTRAMUSCULAR | Status: AC
  Filled 2017-02-04: qty 2

## 2017-02-04 MED ORDER — PROPOFOL 10 MG/ML IV BOLUS
INTRAVENOUS | Status: AC
  Filled 2017-02-04: qty 20

## 2017-02-04 MED ORDER — LEVOFLOXACIN IN D5W 500 MG/100ML IV SOLN
500.0000 mg | INTRAVENOUS | Status: AC
  Administered 2017-02-04 (×2): 500 mg via INTRAVENOUS

## 2017-02-04 MED ORDER — LEVOFLOXACIN IN D5W 500 MG/100ML IV SOLN
INTRAVENOUS | Status: AC
  Filled 2017-02-04: qty 100

## 2017-02-04 MED ORDER — FENTANYL CITRATE (PF) 100 MCG/2ML IJ SOLN
INTRAMUSCULAR | Status: AC
  Filled 2017-02-04: qty 2

## 2017-02-04 MED ORDER — PROPOFOL 500 MG/50ML IV EMUL
INTRAVENOUS | Status: DC | PRN
  Administered 2017-02-04: 150 ug/kg/min via INTRAVENOUS

## 2017-02-04 MED ORDER — GENTAMICIN SULFATE 40 MG/ML IJ SOLN
5.0000 mg/kg | Freq: Once | INTRAVENOUS | Status: AC
  Administered 2017-02-04: 500 mg via INTRAVENOUS
  Filled 2017-02-04: qty 12.5

## 2017-02-04 MED ORDER — SODIUM CHLORIDE 0.9 % IJ SOLN
INTRAMUSCULAR | Status: AC
  Filled 2017-02-04: qty 10

## 2017-02-04 MED ORDER — GLYCOPYRROLATE 0.2 MG/ML IJ SOLN
INTRAMUSCULAR | Status: AC
  Filled 2017-02-04: qty 1

## 2017-02-04 MED ORDER — FAMOTIDINE 20 MG PO TABS
20.0000 mg | ORAL_TABLET | Freq: Once | ORAL | Status: AC
  Administered 2017-02-04: 20 mg via ORAL

## 2017-02-04 MED ORDER — MIDAZOLAM HCL 2 MG/2ML IJ SOLN
INTRAMUSCULAR | Status: AC
  Filled 2017-02-04: qty 2

## 2017-02-04 MED ORDER — LACTATED RINGERS IV SOLN
INTRAVENOUS | Status: DC
  Administered 2017-02-04: 11:00:00 via INTRAVENOUS

## 2017-02-04 MED ORDER — GLYCOPYRROLATE 0.2 MG/ML IJ SOLN
INTRAMUSCULAR | Status: DC | PRN
  Administered 2017-02-04: 0.2 mg via INTRAVENOUS

## 2017-02-04 MED ORDER — FENTANYL CITRATE (PF) 100 MCG/2ML IJ SOLN: 25.0000 ug | INTRAMUSCULAR | Status: DC | PRN

## 2017-02-04 MED ORDER — FAMOTIDINE 20 MG PO TABS
ORAL_TABLET | ORAL | Status: DC
Start: 2017-02-04 — End: 2017-02-04
  Filled 2017-02-04: qty 1

## 2017-02-04 MED ORDER — ONDANSETRON HCL 4 MG/2ML IJ SOLN: 4.0000 mg | Freq: Once | INTRAMUSCULAR | Status: DC | PRN

## 2017-02-04 MED ORDER — EPHEDRINE SULFATE 50 MG/ML IJ SOLN
INTRAMUSCULAR | Status: DC | PRN
  Administered 2017-02-04: 10 mg via INTRAVENOUS

## 2017-02-04 MED ORDER — PROPOFOL 10 MG/ML IV BOLUS
INTRAVENOUS | Status: DC | PRN
  Administered 2017-02-04: 50 mg via INTRAVENOUS

## 2017-02-04 MED ORDER — FLEET ENEMA 7-19 GM/118ML RE ENEM: 1.0000 | ENEMA | Freq: Once | RECTAL | Status: DC

## 2017-02-04 MED ORDER — EPHEDRINE SULFATE 50 MG/ML IJ SOLN
INTRAMUSCULAR | Status: AC
  Filled 2017-02-04: qty 1

## 2017-02-04 SURGICAL SUPPLY — 21 items
BAG URO DRAIN 2000ML W/SPOUT (MISCELLANEOUS) IMPLANT
BLADE CLIPPER SURG (BLADE) IMPLANT
DRAPE SHEET LG 3/4 BI-LAMINATE (DRAPES) ×3 IMPLANT
DRESSING TELFA 4X3 1S ST N-ADH (GAUZE/BANDAGES/DRESSINGS) ×3 IMPLANT
DRSG TELFA 3X8 NADH (GAUZE/BANDAGES/DRESSINGS) ×3 IMPLANT
GAUZE SPONGE 4X4 12PLY STRL (GAUZE/BANDAGES/DRESSINGS) IMPLANT
GLOVE BIO SURGEON STRL SZ 6.5 (GLOVE) ×2 IMPLANT
GLOVE BIO SURGEON STRL SZ7 (GLOVE) IMPLANT
GLOVE BIO SURGEONS STRL SZ 6.5 (GLOVE) ×1
GUIDE NEEDLE ENDOCAV 16-18 CVR (NEEDLE) ×3 IMPLANT
INST BIOPSY MAXCORE 18GX25 (NEEDLE) ×3 IMPLANT
KIT RM TURNOVER CYSTO AR (KITS) ×3 IMPLANT
NDL DEFLUX 3.7X23X350 (MISCELLANEOUS) ×3
NDL SAFETY 18GX1.5 (NEEDLE) IMPLANT
NDL SAFETY 22GX1.5 (NEEDLE) IMPLANT
NEEDLE DEFLUX 3.7X23X350 (MISCELLANEOUS) ×1 IMPLANT
NEEDLE GUIDE BIOPSY 644068 (NEEDLE) IMPLANT
PREP PVP WINGED SPONGE (MISCELLANEOUS) IMPLANT
SYRINGE 10CC LL (SYRINGE) IMPLANT
SYRINGE IRR TOOMEY STRL 70CC (SYRINGE) IMPLANT
WATER STERILE IRR 1000ML POUR (IV SOLUTION) IMPLANT

## 2017-02-04 NOTE — Op Note (Signed)
Date of procedure: 02/04/17  Preoperative diagnosis:  1. Prostate cancer  Postoperative diagnosis:  1. Prostate cancer   Procedure: 1. Ultrasound guided transrectal prostate needle biopsy  Surgeon: Hollice Espy, MD  Anesthesia: General  Complications: None  Intraoperative findings: 58 cc prostate, nodule at left apex appreciated on TRUS  EBL: minimal  Specimens: prostate biopsy x 12  Indication: Victor Mcgee is a 66 y.o. patient with .  After reviewing the management options for treatment, he elected to proceed with the above surgical procedure(s). We have discussed the potential benefits and risks of the procedure, side effects of the proposed treatment, the likelihood of the patient achieving the goals of the procedure, and any potential problems that might occur during the procedure or recuperation. Informed consent has been obtained.  Description of procedure:  The patient was taken to the operating room and placed under monitored anesthesia care. He remained on the stretcher was placed in the left lateral decubitus position.  He received IV gentamicin and IV ciprofloxacin prior to initiation of the procedure.  At this point in time, exam was performed which revealed slightly decreased sphincter tone with an enlarged 50 cc prostate, no nodules or induration appreciated.  Transrectal ultrasound probe was placed and the prostate was surveyed.  Prostate volume was obtained which was approximately 58 cc apparent and obvious leak and lumbar hypochromic lesions. There was a spherical nodule at the left apex as had been previously appreciated on previous TRUS.  12 needle prostate biopsies was then performed taken from the sextant areas and bilaterally, a total of 12 ultrasound-guided biopsies. The nodule was biopsied with the left lateral apex biopsy.    He tolerated the procedure well and was taken to the PACU in stable condition.    Plan: I will call [name]  as per patient request with his biopsy results.    Hollice Espy, M.D.

## 2017-02-04 NOTE — Anesthesia Procedure Notes (Signed)
Performed by: Shona Pardo Pre-anesthesia Checklist: Patient identified, Emergency Drugs available, Suction available and Patient being monitored Patient Re-evaluated:Patient Re-evaluated prior to inductionOxygen Delivery Method: Nasal cannula Intubation Type: IV induction Dental Injury: Teeth and Oropharynx as per pre-operative assessment  Comments: Nasal cannula with etCO2 monitoring       

## 2017-02-04 NOTE — Anesthesia Postprocedure Evaluation (Signed)
Anesthesia Post Note  Patient: Victor Mcgee  Procedure(s) Performed: Procedure(s) (LRB): TRANSRECTAL PROSTATE BIOPSY (N/A)  Patient location during evaluation: PACU Anesthesia Type: MAC Level of consciousness: awake Pain management: pain level controlled Vital Signs Assessment: post-procedure vital signs reviewed and stable Respiratory status: spontaneous breathing Cardiovascular status: stable Anesthetic complications: no     Last Vitals:  Vitals:   02/04/17 1223 02/04/17 1247  BP: 115/76 122/73  Pulse: 63 62  Resp: 19 18  Temp: 36.4 C 36.4 C    Last Pain:  Vitals:   02/04/17 1247  TempSrc: Oral  PainSc:                  VAN STAVEREN,[name]

## 2017-02-04 NOTE — Anesthesia Preprocedure Evaluation (Signed)
Anesthesia Evaluation  Patient identified by MRN, date of birth, ID band Patient awake    Reviewed: Allergy & Precautions, NPO status , Patient's Chart, lab work & pertinent test results  Airway Mallampati: III       Dental  (+) Teeth Intact   Pulmonary COPD, former smoker,     + decreased breath sounds      Cardiovascular Exercise Tolerance: Good hypertension, Pt. on medications and Pt. on home beta blockers  Rhythm:Regular  Vaso vagal in Surgeon's office   Neuro/Psych MS  Short term memory loss  Neuromuscular disease    GI/Hepatic Neg liver ROS, GERD  Medicated,  Endo/Other  diabetes, Type 2  Renal/GU negative Renal ROS     Musculoskeletal   Abdominal Normal abdominal exam  (+)   Peds  Hematology   Anesthesia Other Findings   Reproductive/Obstetrics                             Anesthesia Physical Anesthesia Plan  ASA: III  Anesthesia Plan: MAC   Post-op Pain Management:    Induction: Intravenous  Airway Management Planned: Natural Airway and Nasal Cannula  Additional Equipment:   Intra-op Plan:   Post-operative Plan:   Informed Consent: I have reviewed the patients History and Physical, chart, labs and discussed the procedure including the risks, benefits and alternatives for the proposed anesthesia with the patient or authorized representative who has indicated his/her understanding and acceptance.     Plan Discussed with: CRNA and Surgeon  Anesthesia Plan Comments:         Anesthesia Quick Evaluation

## 2017-02-04 NOTE — H&P (Signed)
10:58 AM  Seen and examined on 02/04/17 without changes RRR CTAB   Gregory Kollmann 04-26-1951 [id number]  Referring provider: Glendon Axe       Chief Complaint  Patient presents with  . Prostate Cancer    36month    HPI: 66 year old AAM with MS with elevated PSA to 8.87 (repeat 10.4) ng/dL, family history of prostate cancer including 2 brothers, and prostatic asymmetry R>L s/p prostate biopsy on 2/24 with pathology c/w Gleason 3+3 involving 6/12 cores bilaterally ranging from 1-44%.  TRUS volume 67 cc.    He denies any significant urinary symptoms other than occasional urge incontinence. He is wheelchair bound and has been for many years related to his multiple sclerosis. Occasionally, he has difficulty getting to the bathroom on time due to transfer issues and mobility. These episodes are fairly rare. He denies any baseline urinary urgency, frequency, or nocturia. His stream is good. He does feel that he is able to empty his bladder completely. No UTIs or gross hematuria.  He is not sexually active.    No weight loss or bone pain.   He returns today for PSA as per surveillance protocol.  PMH:     Past Medical History:  Diagnosis Date  . Acid reflux 01/25/2016  . BP (high blood pressure) 01/25/2016  . Controlled type 2 diabetes mellitus without complication ([place]) 99991111  . Elevated prostate specific antigen (PSA) 01/25/2016  . HLD (hyperlipidemia) 01/25/2016  . Incomplete paraplegia ([place]) 07/18/2014  . Multiple sclerosis ([place]) 01/25/2016    Surgical History:      Past Surgical History:  Procedure Laterality Date  . none      Home Medications:        Medication List           Accurate as of 11/19/16 10:58 AM. Always use your most recent med list.           baclofen 10 MG tablet Commonly known as:  LIORESAL Take by mouth.   hydrochlorothiazide 12.5 MG capsule Commonly known as:  MICROZIDE Take by mouth.   lisinopril 10 MG  tablet Commonly known as:  PRINIVIL,ZESTRIL Take by mouth.   memantine 10 MG tablet Commonly known as:  NAMENDA Take 1 tablet by mouth two  times daily   metoprolol 50 MG tablet Commonly known as:  LOPRESSOR Take by mouth.   tiZANidine 4 MG capsule Commonly known as:  ZANAFLEX Take 4 mg by mouth 3 (three) times daily.   Transport Chair Misc One power wheelchair for multiple sclerosis with leg weakness.   ZETIA 10 MG tablet Generic drug:  ezetimibe Take by mouth.       Allergies: No Known Allergies  Family History:      Family History  Problem Relation Age of Onset  . Kidney failure Mother   . Kidney cancer Brother   . Prostate cancer Brother     Social History:  reports that he has quit smoking. He does not have any smokeless tobacco history on file. He reports that he does not drink alcohol or use drugs.   Physical Exam: Ht 6\' 2"  (1.88 m)   Constitutional:  Alert and oriented, No acute distress.  In wheel chair.  Accompanied by his nephew today. HEENT: [place] AT, moist mucus membranes.  Trachea midline, no masses. Cardiovascular: No clubbing, cyanosis, or edema. Respiratory: Normal respiratory effort, no increased work of breathing. Skin: No rashes, bruises or suspicious lesions. Neurologic: Lower extremity weakness, able to transfer and stand  with support. Psychiatric: Normal mood and affect.  Laboratory Data: Component     Latest Ref Rng & Units 01/25/2016 07/11/2016 11/12/2016  PSA     0.0 - 4.0 ng/mL 10.4 (H) 8.8 (H) 9.9 (H)   Cr 1.3  Assessment & Plan:    1. Prostate cancer ([place]) Low risk prostate cancer on Active surveillance.  Overall, his PSA remained stable. Rectal exam last visit was also stable.  Patient was initially diagnosed in February/2017. At this point, he will be due for either endorectal MRI versus prostate biopsy. Given his episode of previous vasovagal episode in our office, I would recommend proceeding with this  in the operating room. He is agreeable with this plan. Reviewed risk of anesthesia, can be performed under monitored anesthesia care.  Prebiopsy instructions were reviewed today with the patient.  We discussed prostate biopsy in detail including the procedure itself, the risks of blood in the urine, stool, and ejaculate, serious infection, and discomfort. He is willing to proceed with this as discussed.  Plan for prostate biopsy in the operating room in February 2018, rectal exam at that time.    2. Urge incontinence Rare.  Minimal bother.  3. Other male erectile dysfunction Not sexually active therefore minimal bother.   Hollice Espy, MD  [facility] [address], [place], [place] [address] [phone]  [name] to help coordinate procedure

## 2017-02-04 NOTE — Anesthesia Post-op Follow-up Note (Cosign Needed)
Anesthesia QCDR form completed.        

## 2017-02-04 NOTE — Transfer of Care (Signed)
Immediate Anesthesia Transfer of Care Note  Patient: Victor Mcgee  Procedure(s) Performed: Procedure(s): TRANSRECTAL PROSTATE BIOPSY (N/A)  Patient Location: PACU  Anesthesia Type:General  Level of Consciousness: sedated  Airway & Oxygen Therapy: Patient Spontanous Breathing and Patient connected to face mask oxygen  Post-op Assessment: Report given to RN and Post -op Vital signs reviewed and stable  Post vital signs: Reviewed and stable  Last Vitals:  Vitals:   02/04/17 1138 02/04/17 1153  BP: 95/62 102/70  Pulse: 74 66  Resp: (!) 21 18  Temp: (!) 36 C     Complications: No apparent anesthesia complications

## 2017-02-04 NOTE — Discharge Instructions (Signed)
  Transrectal Prostate Biopsy Patient Education and Post Procedure Instructions    -Definition A prostate biopsy is the removal of a small amount of tissue from the prostate gland. The tissue is examined to determine whether there is cancer.  -Reasons for Procedure A prostate biopsy is usually done after an abnormal finding by: Digital rectal exam Prostate specific antigen (PSA) blood test A prostate biopsy is the only way to find out if cancer cells are present.  -Possible Complications Problems from the procedure are rare, but all procedures have some risk including: Infection Bruising or lengthy bleeding from the rectum, or in urine or semen Difficulty urinating Reactions to anesthesia Factors that may increase the risk of complications include: Smoking History of bleeding disorders or easy bruising Use of any medications, over-the-counter medications, or herbal supplements Sensitivity or allergy to latex, medications, or anesthesia.  -Prior to Procedure Talk to your doctor about your medications. Blood thinning medications including aspirin should be stopped 1 week prior to procedure. If prescribed by your cardiologist we may need approval before stopping medications. Use a Fleets enema 2 hours before the procedure. Can be purchased at your pharmacy. Antibiotics will be administered in the clinic prior to procedure.  Please make sure you eat a light meal prior to coming in for your appointment. This can help prevent lightheadedness during the procedure and upset stomach from antibiotics. Please bring someone with you to the procedure to drive you home.  -Anesthesia Transrectal biopsy: Local anesthesia--Just the area that is being operated on is numbed using an injectable anesthetic.  -Description of the Procedure Transrectal biopsy--Your doctor will insert a small ultrasound device into the rectum. This device will produce sound waves to create an image of the prostate.  These images will help guide placement of the needle. Your doctor will then insert the needle through the wall of the rectum and into the prostate gland. The procedure should take approximately 15-30 minutes.  -Will It Hurt? You may have discomfort and soreness at the biopsy site. Pain and discomfort after the procedure can be managed with medications.  -Postoperative Care When you return home after the procedure, do the following to help ensure a smooth recovery: Stay hydrated. Drink plenty of fluids for the next few days. Avoid difficult physical activity the day and evening of the procedure. Keep in mind that you may see blood in your urine, stool, or semen for several days. Resume any medications that were stopped when you are advised to do so.  After the sample is taken, it will be sent to a pathologist for examination under a microscope. This doctor will analyze the sample for cancer. You will be scheduled for an appointment to discuss results. If cancer is present, your doctor will work with you to develop a treatment plan.   -Call Your Doctor or Seek Immediate Medical Attention It is important to monitor your recovery. Alert your doctor to any problems. If any of the following occur, call your doctor or go to the emergency room: Fever 100.5 or greater within 1 week post procedure go directly to ER Call the office for: Blood in the urine more than 1 week or in semen for more than 6 weeks post-biopsy Pain that you cannot control with the medications you have been given Pain, burning, urgency, or frequency of urination Cough, shortness of breath, or chest pain- if severe go to ER Heavy rectal bleeding or bleeding that lasts more than 1 week after the biopsy If you   have any questions or concerns please contact our office at [phone] ° °[facility] °[address], [address] °[place], [place] [address] °[phone] ° °AMBULATORY SURGERY  °DISCHARGE  INSTRUCTIONS ° ° °1) The drugs that you were given will stay in your system until tomorrow so for the next 24 hours you should not: ° °A) Drive an automobile °B) Make any legal decisions °C) Drink any alcoholic beverage ° ° °2) You may resume regular meals tomorrow.  Today it is better to start with liquids and gradually work up to solid foods. ° °You may eat anything you prefer, but it is better to start with liquids, then soup and crackers, and gradually work up to solid foods. ° ° °3) Please notify your doctor immediately if you have any unusual bleeding, trouble breathing, redness and pain at the surgery site, drainage, fever, or pain not relieved by medication. ° ° ° °4) Additional Instructions: ° ° ° ° ° ° ° °Please contact your physician with any problems or Same Day Surgery at [phone], Monday through Friday 6 am to 4 pm, or [facility] Main number at [phone]. ° ° ° ° °

## 2017-02-05 ENCOUNTER — Encounter: Payer: Self-pay | Admitting: Urology

## 2017-02-05 LAB — SURGICAL PATHOLOGY

## 2017-02-17 ENCOUNTER — Telehealth: Payer: Self-pay | Admitting: Urology

## 2017-02-17 NOTE — Telephone Encounter (Signed)
Pt sister called office stating that Dr. Erlene Quan had attempted to call her yesterday, she (sister) did not get to answer, however, Dr. Erlene Quan was able to get in touch with and talk to the patient, Victor Mcgee. Dr. Erlene Quan gave him his Bx results.  Sister Victor Mcgee) has called office stating she is scheduled to speak to Dr. Erlene Quan today after 10 am but wanted to let her know that the patient has blood in his urine and his stool and wants to know if this is normal before speaking to Dr. Erlene Quan. Please advise. Thanks.

## 2017-02-18 NOTE — Telephone Encounter (Signed)
Dr. Erlene Quan was notified/SW

## 2017-02-25 ENCOUNTER — Telehealth: Payer: Self-pay | Admitting: Urology

## 2017-02-25 NOTE — Telephone Encounter (Signed)
Called patient to discuss pathology report both with he and his healthcare proxy, Victor Mcgee.  Prostate biopsy is essentially unchanged, similar volume of low risk prostate cancer. We will continue to follow him.   Recommended follow-up in 6 months with PSAs/rectal exam.  Hollice Espy, MD

## 2017-02-26 NOTE — Telephone Encounter (Signed)
Both appt have been made and patient notified  michelle

## 2017-08-31 ENCOUNTER — Other Ambulatory Visit: Payer: Medicare Other

## 2017-08-31 DIAGNOSIS — C61 Malignant neoplasm of prostate: Secondary | ICD-10-CM

## 2017-09-01 LAB — PSA: PROSTATE SPECIFIC AG, SERUM: 13 ng/mL — AB (ref 0.0–4.0)

## 2017-09-04 ENCOUNTER — Encounter: Payer: Self-pay | Admitting: Urology

## 2017-09-04 ENCOUNTER — Ambulatory Visit: Payer: Medicare Other | Admitting: Urology

## 2017-09-08 ENCOUNTER — Telehealth: Payer: Self-pay

## 2017-09-08 NOTE — Telephone Encounter (Signed)
Spoke with pt in reference to needing to r/s appt per Dr. Erlene Quan. Pt stated that he would have his sister call back to make appt.

## 2017-10-23 ENCOUNTER — Ambulatory Visit (INDEPENDENT_AMBULATORY_CARE_PROVIDER_SITE_OTHER): Payer: Medicare Other | Admitting: Urology

## 2017-10-23 ENCOUNTER — Encounter: Payer: Self-pay | Admitting: Urology

## 2017-10-23 VITALS — BP 115/73 | HR 96 | Ht 77.0 in | Wt 258.0 lb

## 2017-10-23 DIAGNOSIS — C61 Malignant neoplasm of prostate: Secondary | ICD-10-CM | POA: Diagnosis not present

## 2017-10-23 DIAGNOSIS — N3941 Urge incontinence: Secondary | ICD-10-CM

## 2017-10-23 DIAGNOSIS — N528 Other male erectile dysfunction: Secondary | ICD-10-CM | POA: Diagnosis not present

## 2017-10-23 NOTE — Progress Notes (Signed)
10:46 AM  10/23/17   Victor Mcgee 1951/03/12 [id number]  Referring provider: Glendon Axe   Chief Complaint  Patient presents with  . Follow-up    Elevated PSA    HPI: 66 year old AAM with MS with low risk prostate cancer on active surveillance.  He initially presented with elevated PSA to 8.87 (repeat 10.4) ng/dL, family history of prostate cancer including 2 brothers, and prostatic asymmetry R>L s/p prostate biopsy on 02/15/16 with pathology c/w Gleason 3+3 involving 6/12 cores bilaterally ranging from 1-44%.  TRUS volume 67 cc.    He underwent confirmatory biopsy on 2/14 2018 in the operating room (vasovagal episode with first prostate biopsy) revealing a 58 g prostate.  Rectal exam at this time showed no nodules or induration.  Prostate biopsy for presence of low risk prostate cancer, Gleason 3+3 involving 2 cores up to 25% of the tissue.  Most recently, his PSA appears to have risen.   His most recent PSA on 08/31/2017 was 13.0.  This was repeated today and is pending.  He denies any significant urinary symptoms other than occasional urge incontinence. He is wheelchair bound and has been for many years related to his multiple sclerosis. Occasionally, he has difficulty getting to the bathroom on time due to transfer issues and mobility. These episodes are fairly rare. He denies any baseline urinary urgency, frequency, or nocturia. His stream is good. He does feel that he is able to empty his bladder completely. No UTIs or gross hematuria.  This is unchanged today.    He is not sexually active.    No weight loss or bone pain.   He continues to have issues with short-term memory.  He asks again today that  all labs and follow-up plan to be discussed with his sister who will communicate this with him.   PMH: Past Medical History:  Diagnosis Date  . Acid reflux 01/25/2016  . BP (high blood pressure) 01/25/2016  . Controlled type 2 diabetes mellitus without complication ([place])  06/23/7105  . Elevated prostate specific antigen (PSA) 01/25/2016  . HLD (hyperlipidemia) 01/25/2016  . Incomplete paraplegia () 07/18/2014  . Multiple sclerosis ([place]) 01/25/2016    Surgical History: Past Surgical History:  Procedure Laterality Date  . none    . PROSTATE BIOPSY N/A 02/04/2017   Procedure: TRANSRECTAL PROSTATE BIOPSY;  Surgeon: Hollice Espy, MD;  Location: [facility] ORS;  Service: Urology;  Laterality: N/A;    Home Medications:  Allergies as of 10/23/2017   No Known Allergies     Medication List       Accurate as of 10/23/17 10:46 AM. Always use your most recent med list.          hydrochlorothiazide 12.5 MG capsule Commonly known as:  MICROZIDE Take 12.5 mg by mouth daily.   lisinopril 10 MG tablet Commonly known as:  PRINIVIL,ZESTRIL Take 10 mg by mouth every evening.   memantine 10 MG tablet Commonly known as:  NAMENDA Take 10 mg by mouth 2 (two) times daily.   metoprolol tartrate 50 MG tablet Commonly known as:  LOPRESSOR Take 50 mg by mouth 2 (two) times daily.   tiZANidine 4 MG capsule Commonly known as:  ZANAFLEX Take 4 mg by mouth 2 (two) times daily as needed.   Transport Chair Misc One power wheelchair for multiple sclerosis with leg weakness.   ZETIA 10 MG tablet Generic drug:  ezetimibe Take 10 mg by mouth daily.       Allergies: No Known Allergies  Family History: Family History  Problem Relation Age of Onset  . Kidney failure Mother   . Kidney cancer Brother   . Prostate cancer Brother     Social History:  reports that he has quit smoking. He has never used smokeless tobacco. He reports that he does not drink alcohol or use drugs.   Physical Exam: Ht 6\' 5"  (1.956 m)   Constitutional:  Alert and oriented, No acute distress.  In wheel chair.  Accompanied by his nephew and sister today. HEENT: [place] AT, moist mucus membranes.  Trachea midline, no masses. Cardiovascular: No clubbing, cyanosis, or edema. Respiratory: Normal  respiratory effort, no increased work of breathing. Skin: No rashes, bruises or suspicious lesions. Rectal exam deferred today.  Neurologic: Lower extremity weakness, able to transfer and stand with support. Psychiatric: Normal mood and affect.  Laboratory Data: Component     Latest Ref Rng & Units 01/25/2016 07/11/2016 11/12/2016 08/31/2017  Prostate Specific Ag, Serum     0.0 - 4.0 ng/mL 10.4 (H) 8.8 (H) 9.9 (H) 13.0 (H)   Cr 1.3  Assessment & Plan:    1. Prostate cancer ([place]) Low risk prostate cancer on Active surveillance.  Patient was initially diagnosed in February/2017, confirmatory biopsy in 01/2017 consistent with same diagnosis, Gleason 3+3.  PSA has remained stable until recently without change in his baseline urinary symptoms.  Most recent PSA 13.  We will repeat this today given that it is been nearly 2 months since his last blood draw.  We discussed today that if it remains elevated, would highly consider prostate MRI to assess whether there is any lesions which are unsampled at the time of previous biopsies.  He is previous he tolerated MR and is agreeable to this plan.  2. Urge incontinence Rare.  Minimal bother.  3. Other male erectile dysfunction Not sexually active therefore minimal bother.   Hollice Espy, MD  [facility] [address] [place],  [address] [phone]  [name] to help coordinate procedure

## 2017-10-24 LAB — PSA: Prostate Specific Ag, Serum: 14.6 ng/mL — ABNORMAL HIGH (ref 0.0–4.0)

## 2018-04-27 ENCOUNTER — Ambulatory Visit: Payer: Medicare Other | Admitting: Urology

## 2018-04-30 ENCOUNTER — Ambulatory Visit: Payer: Medicare Other | Admitting: Urology

## 2018-04-30 ENCOUNTER — Encounter: Payer: Self-pay | Admitting: Urology

## 2018-04-30 VITALS — BP 119/75 | HR 49 | Ht 76.0 in | Wt 265.0 lb

## 2018-04-30 DIAGNOSIS — N3941 Urge incontinence: Secondary | ICD-10-CM | POA: Diagnosis not present

## 2018-04-30 DIAGNOSIS — C61 Malignant neoplasm of prostate: Secondary | ICD-10-CM | POA: Diagnosis not present

## 2018-04-30 NOTE — Progress Notes (Signed)
10:19 AM  04/30/18   Victor Mcgee [birth year] [id number]  Referring provider: Glendon Axe   Chief Complaint  Patient presents with  . Prostate Cancer    12month    HPI: 67 year old AAM with MS with low risk prostate cancer on active surveillance.  He initially presented with elevated PSA to 8.87 (repeat 10.4) ng/dL, family history of prostate cancer including 2 brothers, and prostatic asymmetry R>L s/p prostate biopsy on 02/15/16 with pathology c/w Gleason 3+3 involving 6/12 cores bilaterally ranging from 1-44%.  TRUS volume 67 cc.    He underwent confirmatory biopsy on 2/14 2018 in the operating room (vasovagal episode with first prostate biopsy) revealing a 58 g prostate.  Rectal exam at this time showed no nodules or induration.  Prostate biopsy for presence of low risk prostate cancer, Gleason 3+3 involving 2 cores up to 25% of the tissue.  Most recently, his PSA appears to have risen.   His most recent PSA on 08/31/2017 was 13.0.    No significant change in urinary symptoms today.  He continues to have issues with mobility and urge incontinence.  He continues to have issues with short-term memory.  He asks again today that  all labs and follow-up plan to be discussed with his sister or nephew who accompanies him today.  PSA ordered today and is pending.  He has difficulty with transportation thus did not have his PSA prior to our appointment today.   PMH: Past Medical History:  Diagnosis Date  . Acid reflux 01/25/2016  . BP (high blood pressure) 01/25/2016  . Controlled type 2 diabetes mellitus without complication ([place]) 07/23/9561  . Elevated prostate specific antigen (PSA) 01/25/2016  . HLD (hyperlipidemia) 01/25/2016  . Incomplete paraplegia ([place]) 07/18/2014  . Multiple sclerosis ([place]) 01/25/2016    Surgical History: Past Surgical History:  Procedure Laterality Date  . none    . PROSTATE BIOPSY N/A 02/04/2017   Procedure: TRANSRECTAL PROSTATE BIOPSY;  Surgeon: Hollice Espy, MD;  Location: [facility] ORS;  Service: Urology;  Laterality: N/A;    Home Medications:  Allergies as of 04/30/2018   No Known Allergies     Medication List        Accurate as of 04/30/18 10:19 AM. Always use your most recent med list.          baclofen 10 MG tablet Commonly known as:  LIORESAL TAKE 1 TABLET BY MOUTH TWO  TIMES DAILY AS NEEDED   hydrochlorothiazide 12.5 MG capsule Commonly known as:  MICROZIDE Take 12.5 mg by mouth daily.   lisinopril 10 MG tablet Commonly known as:  PRINIVIL,ZESTRIL Take 10 mg by mouth every evening.   memantine 10 MG tablet Commonly known as:  NAMENDA Take 10 mg by mouth 2 (two) times daily.   metoprolol tartrate 50 MG tablet Commonly known as:  LOPRESSOR Take 50 mg by mouth 2 (two) times daily.   Transport Chair Misc One power wheelchair for multiple sclerosis with leg weakness.   ZETIA 10 MG tablet Generic drug:  ezetimibe Take 10 mg by mouth daily.       Allergies: No Known Allergies  Family History: Family History  Problem Relation Age of Onset  . Kidney failure Mother   . Kidney disease Mother   . Esophageal cancer Brother   . Prostate cancer Brother   . Colon cancer Brother   . Lung cancer Father   . Cancer Brother        Abdominal  . Kidney disease  Brother   . Kidney disease Brother   . Lung cancer Sister     Social History:  reports that he quit smoking about 25 years ago. He has never used smokeless tobacco. He reports that he does not drink alcohol or use drugs.   Physical Exam: BP 119/75   Pulse (!) 49   Ht 6\' 4"  (1.93 m)   Wt 265 lb (120.2 kg)   BMI 32.26 kg/m   Constitutional:  Alert and oriented, No acute distress.  In wheel chair.  Accompanied by his nephew and sister today. HEENT: [place] AT, moist mucus membranes.  Trachea midline, no masses. Cardiovascular: No clubbing, cyanosis, or edema. Respiratory: Normal respiratory effort, no increased work of breathing. Skin: No rashes, bruises or  suspicious lesions. Rectal exam deferred today.  Neurologic: Lower extremity weakness, able to transfer and stand with support. Psychiatric: Normal mood and affect.  Laboratory Data: Component     Latest Ref Rng & Units 01/25/2016 07/11/2016 11/12/2016 08/31/2017  Prostate Specific Ag, Serum     0.0 - 4.0 ng/mL 10.4 (H) 8.8 (H) 9.9 (H) 13.0 (H)   Cr 1.3  Assessment & Plan:    1. Prostate cancer ([place]) Low risk prostate cancer on Active surveillance.  Patient was initially diagnosed in February/2017, confirmatory biopsy in 01/2017 consistent with same diagnosis, Gleason 3+3.  More recently, his PSA today 6 months repeat is pending.  If it continues to rise, would strongly recommend either treatment for prostate MRI with prostate fusion biopsy.  He is most interested in prostate MRI.  We will let him know based on his results and arrange for the study as needed.   2. Urge incontinence Rare.  Minimal bother.   Hollice Espy, MD  [facility] [address], [place], [place] [address] [phone]  [name] to help coordinate procedure

## 2018-05-01 LAB — PSA: Prostate Specific Ag, Serum: 23.5 ng/mL — ABNORMAL HIGH (ref 0.0–4.0)

## 2018-05-06 ENCOUNTER — Telehealth: Payer: Self-pay | Admitting: Urology

## 2018-05-06 DIAGNOSIS — C61 Malignant neoplasm of prostate: Secondary | ICD-10-CM

## 2018-05-06 NOTE — Telephone Encounter (Signed)
We discussed in clinic last week that if his PSA continues to rise, he will need a prostate MRI.  He was agreeable with this.  His PSA has continued to increase somewhat dramatically now in the 20s.  I have ordered the prostate MRI.  The patient has some confusion and short-term memory issues.  He is asked that we contact his sister to let her know the plan, please do so.  MRI ordered.  He will need follow-up after the MRI is complete.  Hollice Espy, MD

## 2018-05-07 NOTE — Telephone Encounter (Signed)
[facility] patient's sister notified.

## 2018-05-25 ENCOUNTER — Telehealth: Payer: Self-pay | Admitting: Urology

## 2018-05-25 NOTE — Telephone Encounter (Signed)
-----   Message from Hollice Espy, MD sent at 05/18/2018  1:08 PM EDT ----- This patient is supposed to have a prostate MRI.  It does not look like it has been scheduled.  Can you please follow-up on this?  You will need to sister or nephew as the patient often gets confused or forgets.    Hollice Espy, MD

## 2018-05-25 NOTE — Telephone Encounter (Signed)
Scheduled for 06-04-18   [facility]

## 2018-06-04 ENCOUNTER — Ambulatory Visit
Admission: RE | Admit: 2018-06-04 | Discharge: 2018-06-04 | Disposition: A | Discharge status: Home / Self Care | Payer: Medicare Other | Source: Ambulatory Visit | Attending: Urology | Admitting: Urology

## 2018-06-04 DIAGNOSIS — C61 Malignant neoplasm of prostate: Secondary | ICD-10-CM

## 2018-06-04 MED ORDER — GADOBENATE DIMEGLUMINE 529 MG/ML IV SOLN
20.0000 mL | Freq: Once | INTRAVENOUS | Status: AC | PRN
  Administered 2018-06-04: 20 mL via INTRAVENOUS

## 2018-06-08 ENCOUNTER — Telehealth: Payer: Self-pay

## 2018-06-08 NOTE — Telephone Encounter (Signed)
-----   Message from Hollice Espy, MD sent at 06/07/2018  3:41 PM EDT ----- This patient has a fairly significant lesion on his prostate MRI.  This needs to be biopsied using the fusion biopsy technique I described in clinic.  This will need to be done in [place].  Please let the patient as well as either his nephew or [name] know.  He has short-term memory issues.    Please arrange for this to be done.  He can follow-up with me  with results.    Hollice Espy, MD

## 2018-06-08 NOTE — Telephone Encounter (Signed)
Called Victor Mcgee informed her of the information below. She gave verbal understanding, also called pt to inform him. Can we please get pt scheduled for this in [place], thanks

## 2018-06-10 ENCOUNTER — Telehealth: Payer: Self-pay | Admitting: Urology

## 2018-06-10 NOTE — Telephone Encounter (Signed)
Referral entered and sent to [facility]. I will call the patient to schedule his follow up app once I see that he has been scheduled.  Thanks, Sharyn Lull

## 2018-07-09 ENCOUNTER — Telehealth: Payer: Self-pay | Admitting: Urology

## 2018-07-09 NOTE — Telephone Encounter (Signed)
This needs to be a discussion between the patient, his healthcare proxy and myself in the office.  Unfortunately, due to the complexity of the discussion i.e. prostate cancer, this is not something that can happen over the phone and is not reasonable.  Please advise him to keep the follow-up appointment.  Hollice Espy, MD

## 2018-07-09 NOTE — Telephone Encounter (Signed)
I have the patient scheduled to come in on 07-13-18 for his fusion biopsy results and when I called to let them know about the appointment I was informed by his sister Kalman Shan that she was not bringing him to the appointment and that we would have to either see her that day or call her and give them to her over the phone. She said it is to hard to bring him in for an appointment and that if not he was not coming. Please advise on what you want to do. I told her she could not come in and be seen for him and [place] file his insurance.   Sharyn Lull

## 2018-07-12 ENCOUNTER — Other Ambulatory Visit: Payer: Self-pay | Admitting: Urology

## 2018-07-12 NOTE — Telephone Encounter (Signed)
I called back and spoke with her and she said she do her best to get him here. But she was not happy about it.  Sharyn Lull

## 2018-07-13 ENCOUNTER — Encounter: Payer: Self-pay | Admitting: Urology

## 2018-07-13 ENCOUNTER — Ambulatory Visit (INDEPENDENT_AMBULATORY_CARE_PROVIDER_SITE_OTHER): Payer: Medicare Other | Admitting: Urology

## 2018-07-13 VITALS — BP 115/64 | HR 45 | Ht 76.0 in

## 2018-07-13 DIAGNOSIS — C61 Malignant neoplasm of prostate: Secondary | ICD-10-CM | POA: Diagnosis not present

## 2018-07-13 NOTE — Patient Instructions (Signed)

## 2018-07-13 NOTE — Progress Notes (Signed)
07/13/2018 10:11 AM   [name] Sep 14, 1951 [id number]  Referring provider: Glendon Axe, MD [facility] [place], [place] [address]  Chief Complaint  Patient presents with  . Prostate Cancer    Fusion Biopsy discussion    HPI: 67 year old male previously on active surveillance for low risk prostate cancer more recently with a rising PSA.  He returns today after recent MRI fusion biopsy now with significant upstaging of his disease.    Over the past year, his PSA has risen to 13 as of 08/2017, 14 on 10/2017, and as high as 23.5 on 04/2018.    In light of multiple previous biopsies, he underwent an MRI of the prostate showing a PI-RADS 5 lesion involving the right basilar anterior peripheral zone, right mid gland anteriorly as well as posterior lateral peripheral zone, right basilar anterior transition zone and likely anterior stroma on the right.  There is a bulge in the vicinity of the capsule concerning for possible local extraprostatic extension.  There is no evidence of seminal vesicle involvement or pelvic lymphadenopathy.  He underwent prostate fusion biopsy (cognitive fusion) on 07/08/2018 by Dr. Jeffie Pollock at [facility] urology.  This revealed multiple cores of intermediate risk prostate cancer, Gleason 4+3 with evidence of perineural invasion and approximately 80% of Gleason pattern 4 in the area of interest.  In addition to this, he had one additional intermediate risk score of 3+4 at the right mid lateral prostate as well as additional cores of Gleason 3+3.    He does have multiple medical comorbidities including MS with resulting incomplete paraplegia, attention and short-term memory issues amongst others.  He has mobility and transportation issues which is made timely follow-up difficult at times.    He does have baseline urge incontinence.  He is not sexually active.  GU history: He initially presented with elevated PSA to 8.87 (repeat 10.4) ng/dL,  family history of prostate cancer including 2 brothers, and prostatic asymmetry R>L s/p prostate biopsy on 02/15/16 with pathology c/w Gleason 3+3 involving 6/12 cores bilaterally ranging from 1-44%.  TRUS volume 67 cc.    He underwent confirmatory biopsy on 2/14 2018 in the operating room (vasovagal episode with first prostate biopsy) revealing a 58 g prostate.  Rectal exam at this time showed no nodules or induration.  Prostate biopsy for presence of low risk prostate cancer, Gleason 3+3 involving 2 cores up to 25% of the tissue.   PMH: Past Medical History:  Diagnosis Date  . Acid reflux 01/25/2016  . BP (high blood pressure) 01/25/2016  . Controlled type 2 diabetes mellitus without complication ([place]) 06/21/6966  . Elevated prostate specific antigen (PSA) 01/25/2016  . HLD (hyperlipidemia) 01/25/2016  . Incomplete paraplegia ([place]) 07/18/2014  . Multiple sclerosis ([place]) 01/25/2016    Surgical History: Past Surgical History:  Procedure Laterality Date  . none    . PROSTATE BIOPSY N/A 02/04/2017   Procedure: TRANSRECTAL PROSTATE BIOPSY;  Surgeon: Hollice Espy, MD;  Location: [facility] ORS;  Service: Urology;  Laterality: N/A;    Home Medications:  Allergies as of 07/13/2018   No Known Allergies     Medication List        Accurate as of 07/13/18 10:11 AM. Always use your most recent med list.          baclofen 10 MG tablet Commonly known as:  LIORESAL TAKE 1 TABLET BY MOUTH TWO  TIMES DAILY AS NEEDED   hydrochlorothiazide 12.5 MG capsule Commonly known as:  MICROZIDE Take  12.5 mg by mouth daily.   lisinopril 10 MG tablet Commonly known as:  PRINIVIL,ZESTRIL Take 10 mg by mouth every evening.   memantine 10 MG tablet Commonly known as:  NAMENDA Take 10 mg by mouth 2 (two) times daily.   metoprolol tartrate 50 MG tablet Commonly known as:  LOPRESSOR Take 50 mg by mouth 2 (two) times daily.   Transport Chair Misc One power wheelchair for multiple sclerosis with leg weakness.     ZETIA 10 MG tablet Generic drug:  ezetimibe Take 10 mg by mouth daily.       Allergies: No Known Allergies  Family History: Family History  Problem Relation Age of Onset  . Kidney failure Mother   . Kidney disease Mother   . Esophageal cancer Brother   . Prostate cancer Brother   . Colon cancer Brother   . Lung cancer Father   . Cancer Brother        Abdominal  . Kidney disease Brother   . Kidney disease Brother   . Lung cancer Sister     Social History:  reports that he quit smoking about 25 years ago. He has never used smokeless tobacco. He reports that he does not drink alcohol or use drugs.  ROS: UROLOGY Frequent Urination?: No Hard to postpone urination?: No Burning/pain with urination?: No Get up at night to urinate?: No Leakage of urine?: No Urine stream starts and stops?: No Trouble starting stream?: No Do you have to strain to urinate?: No Blood in urine?: No Urinary tract infection?: No Sexually transmitted disease?: No Injury to kidneys or bladder?: No Painful intercourse?: No Weak stream?: No Erection problems?: No Penile pain?: No  Gastrointestinal Nausea?: No Vomiting?: No Indigestion/heartburn?: No Diarrhea?: No Constipation?: No  Constitutional Fever: No Night sweats?: No Weight loss?: No Fatigue?: No  Skin Skin rash/lesions?: No Itching?: No  Eyes Blurred vision?: No Double vision?: No  Ears/Nose/Throat Sore throat?: No Sinus problems?: No  Hematologic/Lymphatic Swollen glands?: No Easy bruising?: No  Cardiovascular Chest pain?: No  Respiratory Cough?: No Shortness of breath?: No  Endocrine Excessive thirst?: No  Musculoskeletal Back pain?: No Joint pain?: No  Neurological Headaches?: No Dizziness?: No  Psychologic Depression?: No Anxiety?: No  Physical Exam: BP 115/64   Pulse (!) 45   Ht 6\' 4"  (1.93 m)   BMI 32.26 kg/m   Constitutional:  Alert and oriented, No acute distress.  Accompanied by  nephew today.  Wheelchair. HEENT: [place] AT, moist mucus membranes.  Trachea midline, no masses. Respiratory: Normal respiratory effort, no increased work of breathing. Skin: No rashes, bruises or suspicious lesions. Neurologic: Lower extremity weakness appreciated. Psychiatric: Normal mood with somewhat flat affect.    Laboratory Data: Lab Results  Component Value Date   WBC 7.5 01/28/2017   HGB 15.3 01/28/2017   HCT 44.9 01/28/2017   MCV 89.8 01/28/2017   PLT 265 01/28/2017    Lab Results  Component Value Date   CREATININE 1.23 01/28/2017   Component     Latest Ref Rng & Units 01/25/2016 07/11/2016 11/12/2016 08/31/2017  Prostate Specific Ag, Serum     0.0 - 4.0 ng/mL 10.4 (H) 8.8 (H) 9.9 (H) 13.0 (H)   Component     Latest Ref Rng & Units 10/23/2017 04/30/2018  Prostate Specific Ag, Serum     0.0 - 4.0 ng/mL 14.6 (H) 23.5 (H)     Pertinent Imaging: CLINICAL DATA:  Elevated PSA level at 23.5 on 04/30/2018. Biopsy on 02/04/2017 revealed Gleason  3+3=6 disease involving 2 cores.  EXAM: MR PROSTATE WITHOUT AND WITH CONTRAST  TECHNIQUE: Multiplanar multisequence MRI images were obtained of the pelvis centered about the prostate. Pre and post contrast images were obtained.  CONTRAST:  65mL MULTIHANCE GADOBENATE DIMEGLUMINE 529 MG/ML IV SOLN  Creatinine was obtained on site at [facility] at [address]  Results: Creatinine 1.3 mg/dL.  COMPARISON:  None.  FINDINGS: Prostate: PI-RADS category 5 lesion involving the right basilar anterior peripheral zone, the right mid gland anterior and posterolateral peripheral zone, the right basilar anterior transition zone, and likely the anterior stroma on the right. Abnormal contour bulge in this vicinity raises concern for the possibility of extraprostatic extension locally. The lesion has a volume of 6.5 cubic cm (3.2 by 1.5 by 2.5 cm) and exhibits early enhancement and abnormal accentuated high B value  diffusion weighted activity. Region of interest # 1.  Volume: 3D volumetric analysis: 77.34 cubic cm (6.2 by 4.9 by 5.3 cm).  Transcapsular spread:  Highly suspicious  Seminal vesicle involvement: Absent  Neurovascular bundle involvement: Absent  Pelvic adenopathy: Absent  Bone metastasis: Absent  Other findings: Sigmoid colon diverticulosis.  IMPRESSION: 1. Lesion of high suspicion for prostate cancer in the right mid gland and basilar peripheral zone with likely involvement of the anterior transition zone and anterior stroma on the right. Accentuated contour convexity along the prostate gland in this vicinity suspicious for early trans capsular spread. Appropriate targeting data sent to [place]. The lesion has a volume of about 6.5 cubic cm, with the overall gland size calculated at 77.3 cubic cm. 2. Sigmoid colon diverticulosis.   Electronically Signed   By: Van Clines M.D.   On: 06/04/2018 13:11  Assessment & Plan:    1. Prostate cancer [facility]) We discussed his progression of prostate cancer, not technically high risk based on his PSA although biopsy is consistent with intermediate risk Gleason 4+3.  Based on his PSA and fairly rapid rise, we will go ahead and plan for bone scan to complete his staging.  There is no evidence of metastatic disease on MRI but there is some concern for extracapsular extension without seminal vesicle involvement.  At this point time, I strongly recommend intervention with the goal of definitive therapy for his prostate cancer.  Given his baseline urinary issues and neurological disease, he is a relatively poor surgical candidate for robotic prostatectomy as this will likely lead to permanent urinary incontinence.  Additionally, there is some concern for extracapsular disease thus he is fairly high risk for need for additional adjuvant or salvage radiation.  We discussed alternatives including external beam radiation versus  external beam radiation with brachii therapy boost along  with 6 months of Lupron.  Both the patient, his nephew are most interested in this.  We discussed efficacy which is essentially comparable in terms of local control and recurrence compared to radical prostatectomy.  His overall prognosis is very good.  Once his treatment course has been solidified, he can return to our office to receive his Lupron.  We did go ahead and discussed today possible side effects of Lupron including hot flashes, fatigue, decreased libido, and long-term complications including loss of bone mass, increased central obesity, loss of muscle mass as well as cardiac issues.  We will refer him to radiation oncology.  The patient expressed his concern about compliance with a difficult radiation regimen due to his difficulty dressing and transferring as well as obtaining a reliable ride.  I will reach  out to Mariea Clonts, GU coordinator for the cancer center and see if She can help, but some sort of accommodations or resources to help facilitate transportation to the cancer center for radiation.   - Ambulatory referral to Radiation Oncology - NM Bone Scan Whole Body; Future  Hollice Espy, MD  [facility] [address] [place], [place] [address] [phone]  I spent 40 min with this patient of which greater than 50% was spent in counseling and coordination of care with the patient.   After completion of this consult, as asked to call the patient's sister, Kalman Shan to discuss all of the above.  She reiterated her concerns about transportation.  We will work to help facilitate that.  She is otherwise agreeable with the plan and agree with radiation as likely the better option.

## 2018-07-21 ENCOUNTER — Ambulatory Visit
Admission: RE | Admit: 2018-07-21 | Discharge: 2018-07-21 | Disposition: A | Discharge status: Home / Self Care | Payer: Medicare Other | Source: Ambulatory Visit | Attending: Radiation Oncology | Admitting: Radiation Oncology

## 2018-07-21 ENCOUNTER — Encounter: Payer: Self-pay | Admitting: Radiation Oncology

## 2018-07-21 ENCOUNTER — Other Ambulatory Visit: Payer: Self-pay

## 2018-07-21 ENCOUNTER — Other Ambulatory Visit: Payer: Self-pay | Admitting: *Deleted

## 2018-07-21 VITALS — BP 122/70 | HR 63 | Temp 97.8°F | Resp 18

## 2018-07-21 DIAGNOSIS — Z8042 Family history of malignant neoplasm of prostate: Secondary | ICD-10-CM | POA: Insufficient documentation

## 2018-07-21 DIAGNOSIS — I10 Essential (primary) hypertension: Secondary | ICD-10-CM | POA: Insufficient documentation

## 2018-07-21 DIAGNOSIS — E119 Type 2 diabetes mellitus without complications: Secondary | ICD-10-CM | POA: Insufficient documentation

## 2018-07-21 DIAGNOSIS — C61 Malignant neoplasm of prostate: Secondary | ICD-10-CM | POA: Diagnosis present

## 2018-07-21 DIAGNOSIS — E785 Hyperlipidemia, unspecified: Secondary | ICD-10-CM | POA: Insufficient documentation

## 2018-07-21 DIAGNOSIS — K219 Gastro-esophageal reflux disease without esophagitis: Secondary | ICD-10-CM | POA: Diagnosis not present

## 2018-07-21 DIAGNOSIS — Z79899 Other long term (current) drug therapy: Secondary | ICD-10-CM | POA: Insufficient documentation

## 2018-07-21 DIAGNOSIS — Z87891 Personal history of nicotine dependence: Secondary | ICD-10-CM | POA: Diagnosis not present

## 2018-07-21 DIAGNOSIS — Z800 Family history of malignant neoplasm of digestive organs: Secondary | ICD-10-CM | POA: Diagnosis not present

## 2018-07-21 DIAGNOSIS — Z993 Dependence on wheelchair: Secondary | ICD-10-CM | POA: Insufficient documentation

## 2018-07-21 DIAGNOSIS — G35 Multiple sclerosis: Secondary | ICD-10-CM | POA: Insufficient documentation

## 2018-07-21 DIAGNOSIS — Z801 Family history of malignant neoplasm of trachea, bronchus and lung: Secondary | ICD-10-CM | POA: Insufficient documentation

## 2018-07-21 NOTE — Consult Note (Signed)
NEW PATIENT EVALUATION  Name: Victor Mcgee  MRN: [id number]  Date:   07/21/2018     DOB: Apr 19, 1951   This 67 y.o. 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  REFERRING PHYSICIAN: Glendon Axe, MD  CHIEF COMPLAINT:  Chief Complaint  Patient presents with  . Prostate Cancer    New patient    DIAGNOSIS: The encounter diagnosis was Malignant neoplasm of prostate ([place]).   PREVIOUS INVESTIGATIONS:  Bone scan ordered Pathology report reviewed Clinical notes reviewed  HPI: patient is a 67 year old male with MS who was noted back in 2018 And elevated PSA up to 13. He was under active surveillance for low risk prostate cancer although in July 2019 Dr. Roni Bread performed MRI fusion biopsies showing mostly Gleason 7 (4+3) with evidence of perineural invasion. There was approximately 80% Gleason pattern 4 in the area of interest.initial MRI scan showed high suspicion of prostate cancer in the right gland and basilar peripheral zone with likely involvement of the anterior transition [place] zone and anterior stroma on the right he also may of had a contour convexity along the prostate gland suspicious for early trans-capsular spread. Overall gland size was approximately 65 cc.patient has very little urinary symptoms. He does have extreme difficulty with mobility and is wheelchair-bound. He is had a bone scan scheduled and I have reconfirmed that scheduled and ordered it again today.  PLANNED TREATMENT REGIMEN: I-125 interstitial implant with ADT therapy  PAST MEDICAL HISTORY:  has a past medical history of Acid reflux (01/25/2016), BP (high blood pressure) (01/25/2016), Controlled type 2 diabetes mellitus without complication ([place]) (06/29/2955), Elevated prostate specific antigen (PSA) (01/25/2016), HLD (hyperlipidemia) (01/25/2016), Incomplete paraplegia ([place]) (07/18/2014), and Multiple sclerosis ([place]) (01/25/2016).     PAST SURGICAL HISTORY:  Past Surgical History:  Procedure Laterality Date  . none    . PROSTATE BIOPSY N/A 02/04/2017   Procedure: TRANSRECTAL PROSTATE BIOPSY;  Surgeon: Hollice Espy, MD;  Location: [facility] ORS;  Service: Urology;  Laterality: N/A;    FAMILY HISTORY: family history includes Cancer in his brother; Colon cancer in his brother; Esophageal cancer in his brother; Kidney disease in his brother, brother, and mother; Kidney failure in his mother; Lung cancer in his father and sister; Prostate cancer in his brother.  SOCIAL HISTORY:  reports that he quit smoking about 25 years ago. He has never used smokeless tobacco. He reports that he does not drink alcohol or use drugs.  ALLERGIES: Patient has no known allergies.  MEDICATIONS:  Current Outpatient Medications  Medication Sig Dispense Refill  . baclofen (LIORESAL) 10 MG tablet TAKE 1 TABLET BY MOUTH TWO  TIMES DAILY AS NEEDED    . ezetimibe (ZETIA) 10 MG tablet Take 10 mg by mouth daily.     . hydrochlorothiazide (MICROZIDE) 12.5 MG capsule Take 12.5 mg by mouth daily.     Marland Kitchen lisinopril (PRINIVIL,ZESTRIL) 10 MG tablet Take 10 mg by mouth every evening.     . memantine (NAMENDA) 10 MG tablet Take 10 mg by mouth 2 (two) times daily.    . metoprolol (LOPRESSOR) 50 MG tablet Take 50 mg by mouth 2 (two) times daily.     . Misc. Devices (TRANSPORT CHAIR) MISC One power wheelchair for multiple sclerosis with leg weakness.     No current facility-administered medications for this encounter.     ECOG PERFORMANCE STATUS:  0 - Asymptomatic  REVIEW OF SYSTEMS: the patient  has symptoms of advanced muscular dystrophy. Patient denies any weight loss, fatigue, weakness, fever, chills or night sweats. Patient denies any loss of vision, blurred vision. Patient denies any ringing  of the ears or hearing loss. No irregular heartbeat. Patient denies heart murmur or history of fainting. Patient denies any chest pain or pain radiating to her upper  extremities. Patient denies any shortness of breath, difficulty breathing at night, cough or hemoptysis. Patient denies any swelling in the lower legs. Patient denies any nausea vomiting, vomiting of blood, or coffee ground material in the vomitus. Patient denies any stomach pain. Patient states has had normal bowel movements no significant constipation or diarrhea. Patient denies any dysuria, hematuria or significant nocturia. Patient denies any problems walking, swelling in the joints or loss of balance. Patient denies any skin changes, loss of hair or loss of weight. Patient denies any excessive worrying or anxiety or significant depression. Patient denies any problems with insomnia. Patient denies excessive thirst, polyuria, polydipsia. Patient denies any swollen glands, patient denies easy bruising or easy bleeding. Patient denies any recent infections, allergies or URI. Patient "s visual fields have not changed significantly in recent time.    PHYSICAL EXAM: BP 122/70   Pulse 63   Temp 97.8 F (36.6 C)   Resp 45  Wheelchair-bound male in NAD.Well-developed well-nourished patient in NAD. HEENT reveals PERLA, EOMI, discs not visualized.  Oral cavity is clear. No oral mucosal lesions are identified. Neck is clear without evidence of cervical or supraclavicular adenopathy. Lungs are clear to A&P. Cardiac examination is essentially unremarkable with regular rate and rhythm without murmur rub or thrill. Abdomen is benign with no organomegaly or masses noted. Motor sensory and DTR levels are equal and symmetric in the upper and lower extremities. Cranial nerves II through XII are grossly intact. Proprioception is intact. No peripheral adenopathy or edema is identified. . Crude visual fields are within normal range.  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  PLAN: at this time I ordered his bone scan to rule out the possibility of stage IV disease. I've asked to see him back shortly after that 3 view his results. I've also asked Dr. Cherrie Gauze office to start a DT therapy ASAP. My thinking is he would be extremely difficult patient for external beam radiation therapy and there is only a borderline benefit with his current nomogram to including his pelvic lymph nodes in her treatment fields. He has approximately 816% chance of lymph node involvement. I believe he would be a good candidate for I-125 interstitial implant along with a DT therapy for up to 2 years. I am anticipating based on the large size of his gland over 60 cc that perhaps a couple of months of Lupron therapy may decrease the size and make it more viable for implant. Risks and benefits of seed implantation were discussed with the patient. We have scheduled his bone scan as well as initiating is a DT therapy and I've scheduled for follow-up shortly after his bone scan. At that time will start planning his volume study. Patient seems to comprehend our treatment plan well.  I would like to take this opportunity to thank you for allowing me to participate in the care of your patient.Noreene Filbert, MD

## 2018-07-28 ENCOUNTER — Encounter
Admission: RE | Admit: 2018-07-28 | Discharge: 2018-07-28 | Disposition: A | Discharge status: Home / Self Care | Payer: Medicare Other | Source: Ambulatory Visit | Attending: Urology | Admitting: Urology

## 2018-07-28 DIAGNOSIS — C61 Malignant neoplasm of prostate: Secondary | ICD-10-CM | POA: Insufficient documentation

## 2018-07-28 MED ORDER — TECHNETIUM TC 99M MEDRONATE IV KIT
20.0000 | PACK | Freq: Once | INTRAVENOUS | Status: AC | PRN
  Administered 2018-07-28: 23.72 via INTRAVENOUS

## 2018-08-02 ENCOUNTER — Telehealth: Payer: Self-pay | Admitting: Urology

## 2018-08-02 DIAGNOSIS — C61 Malignant neoplasm of prostate: Secondary | ICD-10-CM

## 2018-08-02 NOTE — Telephone Encounter (Signed)
Called Victor Mcgee, informed her of the results she gave verbal understanding. Called pt, no answer.

## 2018-08-02 NOTE — Telephone Encounter (Signed)
Please let this patient know that his bone scan looked fine except for small area in his spine which is most likely arthritis but could be disease.  To figure this out clearly, ordered some plain film x-rays of his spine.  At his earliest convenience, please have him go to radiology and get the studies done.  Please let the patient know as well as [name] to help coordinate.   Hollice Espy, MD

## 2018-08-06 ENCOUNTER — Ambulatory Visit
Admission: RE | Admit: 2018-08-06 | Discharge: 2018-08-06 | Disposition: A | Discharge status: Home / Self Care | Payer: Medicare Other | Source: Ambulatory Visit | Attending: Urology | Admitting: Urology

## 2018-08-06 ENCOUNTER — Other Ambulatory Visit: Payer: Self-pay | Admitting: *Deleted

## 2018-08-06 ENCOUNTER — Other Ambulatory Visit: Payer: Self-pay

## 2018-08-06 ENCOUNTER — Ambulatory Visit
Admission: RE | Admit: 2018-08-06 | Discharge: 2018-08-06 | Disposition: A | Discharge status: Home / Self Care | Payer: Medicare Other | Source: Ambulatory Visit | Attending: Radiation Oncology | Admitting: Radiation Oncology

## 2018-08-06 ENCOUNTER — Ambulatory Visit (INDEPENDENT_AMBULATORY_CARE_PROVIDER_SITE_OTHER): Payer: Medicare Other

## 2018-08-06 ENCOUNTER — Encounter: Payer: Self-pay | Admitting: Radiation Oncology

## 2018-08-06 DIAGNOSIS — C61 Malignant neoplasm of prostate: Secondary | ICD-10-CM

## 2018-08-06 DIAGNOSIS — Z993 Dependence on wheelchair: Secondary | ICD-10-CM | POA: Insufficient documentation

## 2018-08-06 DIAGNOSIS — G35 Multiple sclerosis: Secondary | ICD-10-CM | POA: Insufficient documentation

## 2018-08-06 DIAGNOSIS — M419 Scoliosis, unspecified: Secondary | ICD-10-CM | POA: Insufficient documentation

## 2018-08-06 DIAGNOSIS — G8220 Paraplegia, unspecified: Secondary | ICD-10-CM | POA: Insufficient documentation

## 2018-08-06 MED ORDER — LEUPROLIDE ACETATE (6 MONTH) 45 MG IM KIT
45.0000 mg | PACK | Freq: Once | INTRAMUSCULAR | Status: AC
  Administered 2018-08-06: 45 mg via INTRAMUSCULAR

## 2018-08-06 NOTE — Progress Notes (Signed)
Radiation Oncology Follow up Note  Name: Victor Mcgee   Date:   08/06/2018 MRN:  [id number] DOB: 06-26-1951    This 67 y.o. 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 Roni Bread performed an MRI fusion biopsy study showing a Gleason 7 score. We have decided to go ahead with angina deprivation therapy which she received today. Our plan is based on his difficulty with multiple sclerosis to do an I-125 interstitial implant. He had a bone scan which I have reviewed showed some increased uptake in his lumbar spine I've ordered plain films of that area of for clarification. He is otherwise doing fairly well.  COMPLICATIONS OF TREATMENT: none  FOLLOW UP COMPLIANCE: keeps appointments   PHYSICAL EXAM:  BP (P) 113/70 (BP Location: Left Arm)   Pulse (!) (P) 59   Temp (!) (P) 97.2 F (36.2 C) (Tympanic)  Wheelchair-bound male in NAD.Well-developed well-nourished patient in NAD. HEENT reveals PERLA, EOMI, discs not visualized.  Oral cavity is clear. No oral mucosal lesions are identified. Neck is clear without evidence of cervical or supraclavicular adenopathy. Lungs are clear to A&P. Cardiac examination is essentially unremarkable with regular rate and rhythm without murmur rub or thrill. Abdomen is benign with no organomegaly or masses noted. Motor sensory and DTR levels are equal and symmetric in the upper and lower extremities. Cranial nerves II through XII are grossly intact. Proprioception is intact. No peripheral adenopathy or edema is identified. No motor or sensory levels are noted. Crude visual  fields are within normal range.  RADIOLOGY RESULTS: bone scan reviewed plain films of spine ordered.  PLAN: at this time we will see him back in 2 months. I will review his plain films although I believe we are probably dealing with degenerative changes rather than metastatic disease in his lumbar spine.I am hoping androgen deprivation therapy will shrink his prostate and that we can proceed with volume study in anticipation of I-125 interstitial implant. Patient comprehend my treatment plan well. Appointments were made he was sent to radiology for plain films.  I would like to take this opportunity to thank you for allowing me to participate in the care of your patient.Noreene Filbert, MD

## 2018-08-06 NOTE — Progress Notes (Signed)
Lupron IM Injection   Due to Prostate Cancer patient is present today for a Lupron Injection.  Medication: Lupron 6 month Dose: 45 mg  Location: left upper outer buttocks Lot: [id number] Exp: 11/13/2020  Patient tolerated well, no complications were noted  Performed by: Gordy Clement, CMA ([place])  Follow up: As scheduled.

## 2018-08-09 ENCOUNTER — Telehealth: Payer: Self-pay | Admitting: Urology

## 2018-08-09 NOTE — Telephone Encounter (Signed)
-----   Message from Hollice Espy, MD sent at 08/06/2018  5:14 PM EDT ----- Back Xrays look fine.    Hollice Espy, MD

## 2018-08-09 NOTE — Telephone Encounter (Signed)
I think should have gone to [facility]   Victor Mcgee

## 2018-08-11 NOTE — Telephone Encounter (Signed)
Patient notified

## 2018-10-21 ENCOUNTER — Encounter: Payer: Self-pay | Admitting: Radiation Oncology

## 2018-10-21 ENCOUNTER — Ambulatory Visit
Admission: RE | Admit: 2018-10-21 | Discharge: 2018-10-21 | Disposition: A | Discharge status: Home / Self Care | Payer: Medicare Other | Source: Ambulatory Visit | Attending: Radiation Oncology | Admitting: Radiation Oncology

## 2018-10-21 ENCOUNTER — Other Ambulatory Visit: Payer: Self-pay

## 2018-10-21 DIAGNOSIS — G35 Multiple sclerosis: Secondary | ICD-10-CM | POA: Diagnosis not present

## 2018-10-21 DIAGNOSIS — C61 Malignant neoplasm of prostate: Secondary | ICD-10-CM | POA: Diagnosis not present

## 2018-10-21 NOTE — Progress Notes (Signed)
Radiation Oncology Follow up Note  Name: Victor Mcgee   Date:   10/21/2018 MRN:  [id number] DOB: August 22, 1951    This 67 y.o. 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 We've had him on androgen deprivation therapy with Lupron in anticipation of trying to shrink the prostate will we can perform an I-125 interstitial implant. He had a bone scan showing no evidence to suggest metastatic disease..he is seen today in follow-up and is doing well. Specifically denies diarrhea dysuria or any other GI/GU complaints. He is accompanied by multiple family members today.  COMPLICATIONS OF TREATMENT: none  FOLLOW UP COMPLIANCE: keeps appointments   PHYSICAL EXAM:  There were no vitals taken for this visit. Wheelchair-bound male with obvious sequela of multiple sclerosis.Well-developed well-nourished patient in NAD. HEENT reveals PERLA, EOMI, discs not visualized.  Oral cavity is clear. No oral mucosal lesions are identified. Neck is clear without evidence of cervical or supraclavicular adenopathy. Lungs are clear to A&P. Cardiac examination is essentially unremarkable with regular rate and rhythm without murmur rub or thrill. Abdomen is benign with no organomegaly or masses noted. Motor sensory and DTR levels are equal and symmetric in the upper and lower extremities. Cranial nerves II through XII are grossly intact. Proprioception is intact. No peripheral adenopathy or edema is identified. No motor or sensory levels are noted. Crude visual fields are within normal range.  RADIOLOGY RESULTS: bone scan and plain films of the lumbar spine  are reviewed showing scar sclerosis and degenerative changes no evidence to suggest metastatic disease.  PLAN: at this time we are going to go ahead and schedule volume study with Dr. Erlene Quan. Hopefully his prostate will shrunk were we can perform a I-125 interstitial implant. Daily radiation therapy treatments were patient with his comorbidities of multiple sclerosis will be extremely difficult. Patient and family all comprehend my treatment plan well. Risks and benefits of implant including radiation safety precautions were all reviewed with the family and patient. They all seem to comprehend my treatment plan well.  I would like to take this opportunity to thank you for allowing me to participate in the care of your patient.Noreene Filbert, MD

## 2018-10-22 ENCOUNTER — Other Ambulatory Visit: Payer: Self-pay | Admitting: Radiology

## 2018-10-22 ENCOUNTER — Telehealth: Payer: Self-pay | Admitting: Radiology

## 2018-10-22 DIAGNOSIS — C61 Malignant neoplasm of prostate: Secondary | ICD-10-CM

## 2018-10-22 NOTE — Telephone Encounter (Signed)
Discussed prostate volume study & seed implant appointments with patient. Notified patient that he will not need to see pre-admission testing prior to the volume study but they will call him to schedule an appointment prior to the seed implants. Questions answered. Patient expresses understanding of conversation.

## 2018-10-22 NOTE — Telephone Encounter (Addendum)
Dr Erlene Quan, This patient is scheduled to have a volume study with you on 11/01/2018 & seed implants on 11/29/2018.  He is also scheduled to see you on 11/10/2018 for a DRE & PSA check. Should the appointment with you be pushed out until after the seed implants? When should he return to clinic? He is scheduled for simulation in January.

## 2018-10-25 NOTE — Telephone Encounter (Signed)
This was an old appointment.  Please make sure that he is scheduled to come in for Lupron when it is due.  I also would like to see him in 6 weeks postop after seed implantation.  Hollice Espy, MD

## 2018-10-26 NOTE — Telephone Encounter (Signed)
Follow up appointment with Dr Erlene Quan was changed & patient was notified of pre-admission testing appointment prior to seed implants. Patient was reminded of appointment for Lupron injection as well & appointment reminders were mailed to the patient. Questions answered. Patient expresses understanding of all instructions.

## 2018-11-01 ENCOUNTER — Ambulatory Visit
Admission: RE | Admit: 2018-11-01 | Discharge: 2018-11-01 | Disposition: A | Discharge status: Home / Self Care | Payer: Medicare Other | Source: Ambulatory Visit | Attending: Radiation Oncology | Admitting: Radiation Oncology

## 2018-11-01 ENCOUNTER — Encounter: Payer: Self-pay | Admitting: Radiation Oncology

## 2018-11-01 ENCOUNTER — Other Ambulatory Visit: Payer: Self-pay

## 2018-11-01 ENCOUNTER — Ambulatory Visit: Admission: RE | Admit: 2018-11-01 | Payer: Medicare Other | Source: Ambulatory Visit | Admitting: Urology

## 2018-11-01 VITALS — BP 123/68 | HR 55 | Temp 97.2°F | Resp 18

## 2018-11-01 DIAGNOSIS — E119 Type 2 diabetes mellitus without complications: Secondary | ICD-10-CM | POA: Diagnosis not present

## 2018-11-01 DIAGNOSIS — G8220 Paraplegia, unspecified: Secondary | ICD-10-CM | POA: Insufficient documentation

## 2018-11-01 DIAGNOSIS — Z800 Family history of malignant neoplasm of digestive organs: Secondary | ICD-10-CM | POA: Diagnosis not present

## 2018-11-01 DIAGNOSIS — C61 Malignant neoplasm of prostate: Secondary | ICD-10-CM

## 2018-11-01 DIAGNOSIS — G35 Multiple sclerosis: Secondary | ICD-10-CM | POA: Diagnosis not present

## 2018-11-01 DIAGNOSIS — Z801 Family history of malignant neoplasm of trachea, bronchus and lung: Secondary | ICD-10-CM | POA: Diagnosis not present

## 2018-11-01 DIAGNOSIS — K219 Gastro-esophageal reflux disease without esophagitis: Secondary | ICD-10-CM | POA: Insufficient documentation

## 2018-11-01 DIAGNOSIS — Z79899 Other long term (current) drug therapy: Secondary | ICD-10-CM | POA: Diagnosis not present

## 2018-11-01 DIAGNOSIS — Z87891 Personal history of nicotine dependence: Secondary | ICD-10-CM | POA: Diagnosis not present

## 2018-11-01 DIAGNOSIS — I10 Essential (primary) hypertension: Secondary | ICD-10-CM | POA: Diagnosis not present

## 2018-11-01 SURGERY — ULTRASOUND, PROSTATE, FOR VOLUME DETERMINATION
Anesthesia: Choice

## 2018-11-01 NOTE — Progress Notes (Signed)
NEW PATIENT EVALUATION  Name: Victor Mcgee  MRN: [id number]  Date:   11/01/2018     DOB: Dec 15, 1951   This 67 y.o. male patient presents to the clinic for History and physical as well as volume study for I-125 interstitial implant for prostate cancer  REFERRING PHYSICIAN: Glendon Axe, MD  CHIEF COMPLAINT:  Chief Complaint  Patient presents with  . Prostate Cancer    Pt is here post volume study    DIAGNOSIS: The encounter diagnosis was Malignant neoplasm of prostate ([place]).   PREVIOUS INVESTIGATIONS:  Clinical notes reviewed  HPI: patient is a 67 year old male with paraplegia secondary to multiple sclerosis who was originally consult back in July 2019. He originally presented with an elevated PSA of 13 MRI fusion study revealed Gleason 7 (4+3) with evidence of perineural invasion. MRI showed high suspicion of prostate cancer in the right gland and basilar peripheral zone. His overall gland size was 65 cc we started him on androgen deprivation therapy in attempts to perform an I-125 interstitial implant with curative intent. He has significant mobility problems secondary to his multiple sclerosis.e did initially have a bone scan showing no evidence of metastatic disease.  PLANNED TREATMENT REGIMEN: I-125 interstitial implant  PAST MEDICAL HISTORY:  has a past medical history of Acid reflux (01/25/2016), BP (high blood pressure) (01/25/2016), Controlled type 2 diabetes mellitus without complication ([place]) (05/29/5915), Elevated prostate specific antigen (PSA) (01/25/2016), HLD (hyperlipidemia) (01/25/2016), Incomplete paraplegia ([place]) (07/18/2014), and Multiple sclerosis ([place]) (01/25/2016).    PAST SURGICAL HISTORY:  Past Surgical History:  Procedure Laterality Date  . none    . PROSTATE BIOPSY N/A 02/04/2017   Procedure: TRANSRECTAL PROSTATE BIOPSY;  Surgeon: Hollice Espy, MD;  Location: [facility] ORS;  Service: Urology;  Laterality: N/A;    FAMILY HISTORY: family history includes Cancer in his  brother; Colon cancer in his brother; Esophageal cancer in his brother; Kidney disease in his brother, brother, and mother; Kidney failure in his mother; Lung cancer in his father and sister; Prostate cancer in his brother.  SOCIAL HISTORY:  reports that he quit smoking about 26 years ago. He has never used smokeless tobacco. He reports that he does not drink alcohol or use drugs.  ALLERGIES: Patient has no known allergies.  MEDICATIONS:  Current Outpatient Medications  Medication Sig Dispense Refill  . baclofen (LIORESAL) 10 MG tablet Take 10 mg by mouth 2 (two) times daily.     Marland Kitchen ezetimibe (ZETIA) 10 MG tablet Take 10 mg by mouth daily.     . hydrochlorothiazide (MICROZIDE) 12.5 MG capsule Take 12.5 mg by mouth daily.     Marland Kitchen lisinopril (PRINIVIL,ZESTRIL) 10 MG tablet Take 10 mg by mouth every evening.     . memantine (NAMENDA) 10 MG tablet Take 10 mg by mouth 2 (two) times daily.    . metoprolol (LOPRESSOR) 50 MG tablet Take 50 mg by mouth 2 (two) times daily.     . Misc. Devices (TRANSPORT CHAIR) MISC One power wheelchair for multiple sclerosis with leg weakness.     No current facility-administered medications for this encounter.     ECOG PERFORMANCE STATUS:  0 - Asymptomatic  REVIEW OF SYSTEMS: except for the multiple sclerosis Patient denies any weight loss, fatigue, weakness, fever, chills or night sweats. Patient denies any loss of vision, blurred vision. Patient denies any ringing  of the ears or hearing loss. No irregular heartbeat. Patient denies heart murmur or history of fainting. Patient denies any chest pain or pain radiating to  her upper extremities. Patient denies any shortness of breath, difficulty breathing at night, cough or hemoptysis. Patient denies any swelling in the lower legs. Patient denies any nausea vomiting, vomiting of blood, or coffee ground material in the vomitus. Patient denies any stomach pain. Patient states has had normal bowel movements no significant  constipation or diarrhea. Patient denies any dysuria, hematuria or significant nocturia. Patient denies any problems walking, swelling in the joints or loss of balance. Patient denies any skin changes, loss of hair or loss of weight. Patient denies any excessive worrying or anxiety or significant depression. Patient denies any problems with insomnia. Patient denies excessive thirst, polyuria, polydipsia. Patient denies any swollen glands, patient denies easy bruising or easy bleeding. Patient denies any recent infections, allergies or URI. Patient "s visual fields have not changed significantly in recent time.    PHYSICAL EXAM: BP 123/68 (BP Location: Left Arm, Patient Position: Sitting)   Pulse (!) 55   Temp (!) 97.2 F (36.2 C) (Tympanic)   Resp 18  A well-developed wheelchair-bound male withlower extremity weakness.Well-developed well-nourished patient in NAD. HEENT reveals PERLA, EOMI, discs not visualized.  Oral cavity is clear. No oral mucosal lesions are identified. Neck is clear without evidence of cervical or supraclavicular adenopathy. Lungs are clear to A&P. Cardiac examination is essentially unremarkable with regular rate and rhythm without murmur rub or thrill. Abdomen is benign with no organomegaly or masses noted. Motor sensory and DTR levels are equal and symmetric in the upper and lower extremities. Cranial nerves II through XII are grossly intact. Proprioception is intact. No peripheral adenopathy or edema is identified. No motor or sensory levels are noted. Crude visual fields are within normal range.  LABORATORY DATA: athology reports reviewed    RADIOLOGY RESULTS:one scan reviewed and compatible with the above-stated findings   IMPRESSION: stage IIB adenocarcinoma the prostate in 67 year old male with multiple sclerosis and paraplegia for I-125 interstitial implant.  PLAN: risks and benefits of treatment including radiation safety precautions were reviewed today with the  patient and his family. We were were able to complete a volume study without significant problems. Patient family have consented to the procedure. Patient from our perspective is clear to proceed. Risks and benefits of treatment including increased lower urinary tract symptoms diarrhea fatigue all were discussed in detail.  I would like to take this opportunity to thank you for allowing me to participate in the care of your patient.Noreene Filbert, MD

## 2018-11-01 NOTE — H&P (View-Only) (Signed)
NEW PATIENT EVALUATION  Name: Victor Mcgee  MRN: [id number]  Date:   11/01/2018     DOB: 05/12/1951   This 67 y.o. male patient presents to the clinic for History and physical as well as volume study for I-125 interstitial implant for prostate cancer  REFERRING PHYSICIAN: Glendon Axe, MD  CHIEF COMPLAINT:  Chief Complaint  Patient presents with  . Prostate Cancer    Pt is here post volume study    DIAGNOSIS: The encounter diagnosis was Malignant neoplasm of prostate ([place]).   PREVIOUS INVESTIGATIONS:  Clinical notes reviewed  HPI: patient is a 67 year old male with paraplegia secondary to multiple sclerosis who was originally consult back in July 2019. He originally presented with an elevated PSA of 13 MRI fusion study revealed Gleason 7 (4+3) with evidence of perineural invasion. MRI showed high suspicion of prostate cancer in the right gland and basilar peripheral zone. His overall gland size was 65 cc we started him on androgen deprivation therapy in attempts to perform an I-125 interstitial implant with curative intent. He has significant mobility problems secondary to his multiple sclerosis.e did initially have a bone scan showing no evidence of metastatic disease.  PLANNED TREATMENT REGIMEN: I-125 interstitial implant  PAST MEDICAL HISTORY:  has a past medical history of Acid reflux (01/25/2016), BP (high blood pressure) (01/25/2016), Controlled type 2 diabetes mellitus without complication ([place]) (0/04/3975), Elevated prostate specific antigen (PSA) (01/25/2016), HLD (hyperlipidemia) (01/25/2016), Incomplete paraplegia ([place]) (07/18/2014), and Multiple sclerosis ([place]) (01/25/2016).    PAST SURGICAL HISTORY:  Past Surgical History:  Procedure Laterality Date  . none    . PROSTATE BIOPSY N/A 02/04/2017   Procedure: TRANSRECTAL PROSTATE BIOPSY;  Surgeon: Hollice Espy, MD;  Location: [facility] ORS;  Service: Urology;  Laterality: N/A;    FAMILY HISTORY: family history includes Cancer in his  brother; Colon cancer in his brother; Esophageal cancer in his brother; Kidney disease in his brother, brother, and mother; Kidney failure in his mother; Lung cancer in his father and sister; Prostate cancer in his brother.  SOCIAL HISTORY:  reports that he quit smoking about 26 years ago. He has never used smokeless tobacco. He reports that he does not drink alcohol or use drugs.  ALLERGIES: Patient has no known allergies.  MEDICATIONS:  Current Outpatient Medications  Medication Sig Dispense Refill  . baclofen (LIORESAL) 10 MG tablet Take 10 mg by mouth 2 (two) times daily.     Marland Kitchen ezetimibe (ZETIA) 10 MG tablet Take 10 mg by mouth daily.     . hydrochlorothiazide (MICROZIDE) 12.5 MG capsule Take 12.5 mg by mouth daily.     Marland Kitchen lisinopril (PRINIVIL,ZESTRIL) 10 MG tablet Take 10 mg by mouth every evening.     . memantine (NAMENDA) 10 MG tablet Take 10 mg by mouth 2 (two) times daily.    . metoprolol (LOPRESSOR) 50 MG tablet Take 50 mg by mouth 2 (two) times daily.     . Misc. Devices (TRANSPORT CHAIR) MISC One power wheelchair for multiple sclerosis with leg weakness.     No current facility-administered medications for this encounter.     ECOG PERFORMANCE STATUS:  0 - Asymptomatic  REVIEW OF SYSTEMS: except for the multiple sclerosis Patient denies any weight loss, fatigue, weakness, fever, chills or night sweats. Patient denies any loss of vision, blurred vision. Patient denies any ringing  of the ears or hearing loss. No irregular heartbeat. Patient denies heart murmur or history of fainting. Patient denies any chest pain or pain radiating to  her upper extremities. Patient denies any shortness of breath, difficulty breathing at night, cough or hemoptysis. Patient denies any swelling in the lower legs. Patient denies any nausea vomiting, vomiting of blood, or coffee ground material in the vomitus. Patient denies any stomach pain. Patient states has had normal bowel movements no significant  constipation or diarrhea. Patient denies any dysuria, hematuria or significant nocturia. Patient denies any problems walking, swelling in the joints or loss of balance. Patient denies any skin changes, loss of hair or loss of weight. Patient denies any excessive worrying or anxiety or significant depression. Patient denies any problems with insomnia. Patient denies excessive thirst, polyuria, polydipsia. Patient denies any swollen glands, patient denies easy bruising or easy bleeding. Patient denies any recent infections, allergies or URI. Patient "s visual fields have not changed significantly in recent time.    PHYSICAL EXAM: BP 123/68 (BP Location: Left Arm, Patient Position: Sitting)   Pulse (!) 55   Temp (!) 97.2 F (36.2 C) (Tympanic)   Resp 18  A well-developed wheelchair-bound male withlower extremity weakness.Well-developed well-nourished patient in NAD. HEENT reveals PERLA, EOMI, discs not visualized.  Oral cavity is clear. No oral mucosal lesions are identified. Neck is clear without evidence of cervical or supraclavicular adenopathy. Lungs are clear to A&P. Cardiac examination is essentially unremarkable with regular rate and rhythm without murmur rub or thrill. Abdomen is benign with no organomegaly or masses noted. Motor sensory and DTR levels are equal and symmetric in the upper and lower extremities. Cranial nerves II through XII are grossly intact. Proprioception is intact. No peripheral adenopathy or edema is identified. No motor or sensory levels are noted. Crude visual fields are within normal range.  LABORATORY DATA: athology reports reviewed    RADIOLOGY RESULTS:one scan reviewed and compatible with the above-stated findings   IMPRESSION: stage IIB adenocarcinoma the prostate in 67 year old male with multiple sclerosis and paraplegia for I-125 interstitial implant.  PLAN: risks and benefits of treatment including radiation safety precautions were reviewed today with the  patient and his family. We were were able to complete a volume study without significant problems. Patient family have consented to the procedure. Patient from our perspective is clear to proceed. Risks and benefits of treatment including increased lower urinary tract symptoms diarrhea fatigue all were discussed in detail.  I would like to take this opportunity to thank you for allowing me to participate in the care of your patient.Noreene Filbert, MD

## 2018-11-01 NOTE — Progress Notes (Signed)
Radiation Oncology Volume study Note  Name: Victor Mcgee   Date:   11/01/2018 MRN:  [id number] DOB: [birth year]    This 67 y.o. male presents to the hospital today for 5 study in preparation for I-125 interstitial implantfor adenocarcinoma the prostate  REFERRING PROVIDER: Glendon Axe, MD  HPI:  patient is a 67 year old male with paraplegia secondary to multiple sclerosis who was originally consult back in July 2019. He originally presented with an elevated PSA of 13 MRI fusion study revealed Gleason 7 (4+3) with evidence of perineural invasion. MRI showed high suspicion of prostate cancer in the right gland and basilar peripheral zone. His overall gland size was 65 cc we started him on androgen deprivation therapy in attempts to perform an I-125 interstitial implant with curative intent. He has significant mobility problems secondary to his multiple sclerosis.e did initially have a bone scan showing no evidence of metastatic disease.  COMPLICATIONS OF TREATMENT: none  FOLLOW UP COMPLIANCE: keeps appointments   PHYSICAL EXAM:  There were no vitals taken for this visit. Except for the limitations of his multiple sclerosisWell-developed well-nourished patient in NAD. HEENT reveals PERLA, EOMI, discs not visualized.  Oral cavity is clear. No oral mucosal lesions are identified. Neck is clear without evidence of cervical or supraclavicular adenopathy. Lungs are clear to A&P. Cardiac examination is essentially unremarkable with regular rate and rhythm without murmur rub or thrill. Abdomen is benign with no organomegaly or masses noted. Motor sensory and DTR levels are equal and symmetric in the upper and lower extremities. Cranial nerves II through XII are grossly intact. Proprioception is intact. No peripheral adenopathy or edema is identified. No motor or sensory levels are noted. Crude visual fields are within normal range.  RADIOLOGY RESULTS: ltrasound would use for volume study today  PLAN:  Patient was taken to the cystoscopy suite in the OR. Patient was placed in the low lithotomy position. Foley catheter was placed. Trans-rectal ultrasound probe was inserted into the rectum and prostate seminal vesicles were visualized as well as bladder base. stepping images were performed on a 5 mm increments. Images will be placed in [facility] treatment planning system to determine seed placement coordinates for eventual I-125 interstitial implant. Images will be reviewed with the physics and dosimetry staff for final quality approval. I personally was present for the volume study and assisted in delineation of contour volumes.  At the end of the procedure Foley catheter was removed, rectal ultrasound probe was removed. Patient tolerated his procedures extremely well with no side effects or complaints. Patient has given appointment for interstitial implant date. Consent was signed today as well as history and physical performed in preparation for his outpatient surgical implant.      Noreene Filbert, MD

## 2018-11-10 ENCOUNTER — Ambulatory Visit: Payer: Medicare Other | Admitting: Urology

## 2018-11-16 DIAGNOSIS — C61 Malignant neoplasm of prostate: Secondary | ICD-10-CM | POA: Diagnosis not present

## 2018-11-22 ENCOUNTER — Other Ambulatory Visit: Payer: Self-pay

## 2018-11-22 ENCOUNTER — Encounter
Admission: RE | Admit: 2018-11-22 | Discharge: 2018-11-22 | Disposition: A | Discharge status: Home / Self Care | Payer: Medicare Other | Source: Ambulatory Visit | Attending: Urology | Admitting: Urology

## 2018-11-22 DIAGNOSIS — Z01818 Encounter for other preprocedural examination: Secondary | ICD-10-CM | POA: Insufficient documentation

## 2018-11-22 DIAGNOSIS — E785 Hyperlipidemia, unspecified: Secondary | ICD-10-CM | POA: Diagnosis not present

## 2018-11-22 DIAGNOSIS — E119 Type 2 diabetes mellitus without complications: Secondary | ICD-10-CM | POA: Diagnosis not present

## 2018-11-22 LAB — BASIC METABOLIC PANEL
Anion gap: 9 (ref 5–15)
BUN: 18 mg/dL (ref 8–23)
CO2: 26 mmol/L (ref 22–32)
CREATININE: 1.21 mg/dL (ref 0.61–1.24)
Calcium: 9.3 mg/dL (ref 8.9–10.3)
Chloride: 104 mmol/L (ref 98–111)
GFR calc Af Amer: >60 mL/min (ref >60)
Glucose, Bld: 84 mg/dL (ref 70–99)
POTASSIUM: 4 mmol/L (ref 3.5–5.1)
SODIUM: 139 mmol/L (ref 135–145)

## 2018-11-22 LAB — CBC
HCT: 42.4 % (ref 39.0–52.0)
Hemoglobin: 13.7 g/dL (ref 13.0–17.0)
MCH: 29.1 pg (ref 26.0–34.0)
MCHC: 32.3 g/dL (ref 30.0–36.0)
MCV: 90.2 fL (ref 80.0–100.0)
PLATELETS: 265 10*3/uL (ref 150–400)
RBC: 4.7 MIL/uL (ref 4.22–5.81)
RDW: 13.3 % (ref 11.5–15.5)
WBC: 8.1 10*3/uL (ref 4.0–10.5)
nRBC: 0 % (ref 0.0–0.2)

## 2018-11-22 NOTE — Patient Instructions (Signed)
Your procedure is scheduled on: Monday 11/29/18 Report to [place]. To find out your arrival time please call [phone] between 1PM - 3PM on Friday 11/26/18.  Remember: Instructions that are not followed completely may result in serious medical risk, up to and including death, or upon the discretion of your surgeon and anesthesiologist your surgery may need to be rescheduled.     _X__ 1. Do not eat food after midnight the night before your procedure.                 No gum chewing or hard candies. You may drink clear liquids up to 2 hours                 before you are scheduled to arrive for your surgery- DO not drink clear                 liquids within 2 hours of the start of your surgery.                 Clear Liquids include:  water, apple juice without pulp, clear carbohydrate                 drink such as Clearfast or Gatorade, Black Coffee or Tea (Do not add                 anything to coffee or tea).  __X__2.  On the morning of surgery brush your teeth with toothpaste and water, you                 may rinse your mouth with mouthwash if you wish.  Do not swallow any              toothpaste of mouthwash.     _X__ 3.  No Alcohol for 24 hours before or after surgery.   _X__ 4.  Do Not Smoke or use e-cigarettes For 24 Hours Prior to Your Surgery.                 Do not use any chewable tobacco products for at least 6 hours prior to                 surgery.  ____  5.  Bring all medications with you on the day of surgery if instructed.   __X__  6.  Notify your doctor if there is any change in your medical condition      (cold, fever, infections).     Do not wear jewelry, make-up, hairpins, clips or nail polish. Do not wear lotions, powders, or perfumes.  Do not shave 48 hours prior to surgery. Men may shave face and neck. Do not bring valuables to the hospital.    [facility] is not responsible for any belongings or  valuables.  Contacts, dentures/partials or body piercings may not be worn into surgery. Bring a case for your contacts, glasses or hearing aids, a denture cup will be supplied. Leave your suitcase in the car. After surgery it may be brought to your room. For patients admitted to the hospital, discharge time is determined by your treatment team.   Patients discharged the day of surgery will not be allowed to drive home.   Please read over the following fact sheets that you were given:   MRSA Information  __X__ Take these medicines the morning of surgery with A SIP OF WATER:  1. baclofen (LIORESAL)  2. memantine (NAMENDA)  3. metoprolol (LOPRESSOR)   4.  5.  6.  ____ Fleet Enema (as directed)   __X__ Use CHG Soap/SAGE wipes as directed  ____ Use inhalers on the day of surgery  ____ Stop metformin/Janumet/Farxiga 2 days prior to surgery    ____ Take 1/2 of usual insulin dose the night before surgery. No insulin the morning          of surgery.   ____ Stop Blood Thinners Coumadin/Plavix/Xarelto/Pleta/Pradaxa/Eliquis/Effient/Aspirin  on   Or contact your Surgeon, Cardiologist or Medical Doctor regarding  ability to stop your blood thinners  __X__ Stop Anti-inflammatories 7 days before surgery such as Advil, Ibuprofen, Motrin,  BC or Goodies Powder, Naprosyn, Naproxen, Aleve, Aspirin    __X__ Stop all herbal supplements, fish oil or vitamin E until after surgery.    ____ Bring C-Pap to the hospital.

## 2018-11-22 NOTE — Pre-Procedure Instructions (Signed)
LM with patients' nephew regarding Fleets Enema to be picked up this week  and used prior to arrival Monday 11/29/18. Nephew verbalized understanding. Note left at front desk and nurses station with enema to be given to patient or caregiver.

## 2018-11-28 MED ORDER — CIPROFLOXACIN IN D5W 400 MG/200ML IV SOLN
400.0000 mg | INTRAVENOUS | Status: AC
  Administered 2018-11-29: 400 mg via INTRAVENOUS

## 2018-11-29 ENCOUNTER — Encounter
Admission: RE | Disposition: A | Discharge status: Home / Self Care | Payer: Self-pay | Source: Ambulatory Visit | Attending: Urology

## 2018-11-29 ENCOUNTER — Ambulatory Visit: Payer: Medicare Other | Admitting: Anesthesiology

## 2018-11-29 ENCOUNTER — Ambulatory Visit
Admission: RE | Admit: 2018-11-29 | Discharge: 2018-11-29 | Disposition: A | Discharge status: Home / Self Care | Payer: Medicare Other | Source: Ambulatory Visit | Attending: Radiation Oncology | Admitting: Radiation Oncology

## 2018-11-29 ENCOUNTER — Ambulatory Visit
Admission: RE | Admit: 2018-11-29 | Discharge: 2018-11-29 | Disposition: A | Discharge status: Home / Self Care | Payer: Medicare Other | Source: Ambulatory Visit | Attending: Urology | Admitting: Urology

## 2018-11-29 ENCOUNTER — Other Ambulatory Visit: Payer: Self-pay

## 2018-11-29 DIAGNOSIS — Z510 Encounter for antineoplastic radiation therapy: Secondary | ICD-10-CM | POA: Diagnosis not present

## 2018-11-29 DIAGNOSIS — Z87891 Personal history of nicotine dependence: Secondary | ICD-10-CM | POA: Insufficient documentation

## 2018-11-29 DIAGNOSIS — E119 Type 2 diabetes mellitus without complications: Secondary | ICD-10-CM | POA: Insufficient documentation

## 2018-11-29 DIAGNOSIS — Z79899 Other long term (current) drug therapy: Secondary | ICD-10-CM | POA: Insufficient documentation

## 2018-11-29 DIAGNOSIS — G8220 Paraplegia, unspecified: Secondary | ICD-10-CM | POA: Insufficient documentation

## 2018-11-29 DIAGNOSIS — C61 Malignant neoplasm of prostate: Secondary | ICD-10-CM | POA: Diagnosis present

## 2018-11-29 DIAGNOSIS — E785 Hyperlipidemia, unspecified: Secondary | ICD-10-CM | POA: Diagnosis not present

## 2018-11-29 DIAGNOSIS — G35 Multiple sclerosis: Secondary | ICD-10-CM | POA: Diagnosis not present

## 2018-11-29 HISTORY — PX: RADIOACTIVE SEED IMPLANT: SHX5150

## 2018-11-29 LAB — GLUCOSE, CAPILLARY
Glucose-Capillary: 106 mg/dL — ABNORMAL HIGH (ref 70–99)
Glucose-Capillary: 109 mg/dL — ABNORMAL HIGH (ref 70–99)

## 2018-11-29 SURGERY — INSERTION, RADIATION SOURCE, PROSTATE
Anesthesia: Regional

## 2018-11-29 MED ORDER — ROCURONIUM BROMIDE 100 MG/10ML IV SOLN
INTRAVENOUS | Status: DC | PRN
  Administered 2018-11-29: 50 mg via INTRAVENOUS

## 2018-11-29 MED ORDER — CIPROFLOXACIN HCL 500 MG PO TABS: 500.0000 mg | ORAL_TABLET | Freq: Two times a day (BID) | ORAL | 0 refills | Status: DC

## 2018-11-29 MED ORDER — ONDANSETRON HCL 4 MG/2ML IJ SOLN
INTRAMUSCULAR | Status: DC | PRN
  Administered 2018-11-29: 4 mg via INTRAVENOUS

## 2018-11-29 MED ORDER — FENTANYL CITRATE (PF) 100 MCG/2ML IJ SOLN
INTRAMUSCULAR | Status: AC
  Filled 2018-11-29: qty 2

## 2018-11-29 MED ORDER — FAMOTIDINE 20 MG PO TABS
20.0000 mg | ORAL_TABLET | Freq: Once | ORAL | Status: AC
  Administered 2018-11-29: 20 mg via ORAL

## 2018-11-29 MED ORDER — SODIUM CHLORIDE 0.9 % IV SOLN: INTRAVENOUS | Status: DC

## 2018-11-29 MED ORDER — TAMSULOSIN HCL 0.4 MG PO CAPS: 0.4000 mg | ORAL_CAPSULE | Freq: Every day | ORAL | 0 refills | Status: AC

## 2018-11-29 MED ORDER — SUGAMMADEX SODIUM 200 MG/2ML IV SOLN
INTRAVENOUS | Status: DC | PRN
  Administered 2018-11-29: 200 mg via INTRAVENOUS

## 2018-11-29 MED ORDER — PROPOFOL 10 MG/ML IV BOLUS
INTRAVENOUS | Status: AC
  Filled 2018-11-29: qty 60

## 2018-11-29 MED ORDER — ACETAMINOPHEN 10 MG/ML IV SOLN
INTRAVENOUS | Status: DC | PRN
  Administered 2018-11-29: 1000 mg via INTRAVENOUS

## 2018-11-29 MED ORDER — LACTATED RINGERS IV SOLN
INTRAVENOUS | Status: DC | PRN
  Administered 2018-11-29: 08:00:00 via INTRAVENOUS

## 2018-11-29 MED ORDER — PHENYLEPHRINE HCL 10 MG/ML IJ SOLN
INTRAMUSCULAR | Status: DC | PRN
  Administered 2018-11-29 (×2): 100 ug via INTRAVENOUS
  Administered 2018-11-29: 150 ug via INTRAVENOUS

## 2018-11-29 MED ORDER — EPHEDRINE SULFATE 50 MG/ML IJ SOLN
INTRAMUSCULAR | Status: DC | PRN
  Administered 2018-11-29 (×3): 5 mg via INTRAVENOUS

## 2018-11-29 MED ORDER — FAMOTIDINE 20 MG PO TABS
ORAL_TABLET | ORAL | Status: AC
  Administered 2018-11-29: 20 mg via ORAL
  Filled 2018-11-29: qty 1

## 2018-11-29 MED ORDER — FLEET ENEMA 7-19 GM/118ML RE ENEM: 1.0000 | ENEMA | Freq: Once | RECTAL | Status: DC

## 2018-11-29 MED ORDER — PROPOFOL 10 MG/ML IV BOLUS
INTRAVENOUS | Status: DC | PRN
  Administered 2018-11-29: 160 mg via INTRAVENOUS

## 2018-11-29 MED ORDER — FENTANYL CITRATE (PF) 100 MCG/2ML IJ SOLN: 25.0000 ug | INTRAMUSCULAR | Status: DC | PRN

## 2018-11-29 MED ORDER — HYDROCODONE-ACETAMINOPHEN 5-325 MG PO TABS: 1.0000 | ORAL_TABLET | Freq: Four times a day (QID) | ORAL | 0 refills | Status: AC | PRN

## 2018-11-29 MED ORDER — LIDOCAINE HCL (CARDIAC) PF 100 MG/5ML IV SOSY
PREFILLED_SYRINGE | INTRAVENOUS | Status: DC | PRN
  Administered 2018-11-29: 100 mg via INTRAVENOUS

## 2018-11-29 MED ORDER — CIPROFLOXACIN IN D5W 400 MG/200ML IV SOLN
INTRAVENOUS | Status: AC
  Filled 2018-11-29: qty 200

## 2018-11-29 MED ORDER — BACITRACIN 500 UNIT/GM EX OINT
TOPICAL_OINTMENT | CUTANEOUS | Status: DC | PRN
  Administered 2018-11-29: 1 via TOPICAL

## 2018-11-29 MED ORDER — ACETAMINOPHEN 10 MG/ML IV SOLN
INTRAVENOUS | Status: AC
  Filled 2018-11-29: qty 100

## 2018-11-29 MED ORDER — FENTANYL CITRATE (PF) 100 MCG/2ML IJ SOLN
INTRAMUSCULAR | Status: DC | PRN
  Administered 2018-11-29 (×2): 25 ug via INTRAVENOUS

## 2018-11-29 MED ORDER — BACITRACIN ZINC 500 UNIT/GM EX OINT
TOPICAL_OINTMENT | CUTANEOUS | Status: AC
  Filled 2018-11-29: qty 28.35

## 2018-11-29 MED ORDER — ONDANSETRON HCL 4 MG/2ML IJ SOLN: 4.0000 mg | Freq: Once | INTRAMUSCULAR | Status: DC | PRN

## 2018-11-29 SURGICAL SUPPLY — 26 items
BAG URINE DRAINAGE (UROLOGICAL SUPPLIES) ×3 IMPLANT
BLADE CLIPPER SURG (BLADE) ×3 IMPLANT
CATH FOL 2WAY LX 16X5 (CATHETERS) ×3 IMPLANT
COVER WAND RF STERILE (DRAPES) IMPLANT
DRAPE INCISE 23X17 IOBAN STRL (DRAPES) ×2
DRAPE INCISE IOBAN 23X17 STRL (DRAPES) ×1 IMPLANT
DRAPE SHEET LG 3/4 BI-LAMINATE (DRAPES) ×3 IMPLANT
DRAPE TABLE BACK 80X90 (DRAPES) ×3 IMPLANT
DRAPE UNDER BUTTOCK W/FLU (DRAPES) ×3 IMPLANT
DRSG TELFA 3X8 NADH (GAUZE/BANDAGES/DRESSINGS) ×3 IMPLANT
GLOVE BIO SURGEON STRL SZ 6.5 (GLOVE) ×2 IMPLANT
GLOVE BIO SURGEON STRL SZ7.5 (GLOVE) ×6 IMPLANT
GLOVE BIO SURGEONS STRL SZ 6.5 (GLOVE) ×1
GOWN STRL REUS W/ TWL LRG LVL3 (GOWN DISPOSABLE) ×2 IMPLANT
GOWN STRL REUS W/ TWL XL LVL3 (GOWN DISPOSABLE) ×1 IMPLANT
GOWN STRL REUS W/TWL LRG LVL3 (GOWN DISPOSABLE) ×4
GOWN STRL REUS W/TWL XL LVL3 (GOWN DISPOSABLE) ×2
IV NS 1000ML (IV SOLUTION) ×2
IV NS 1000ML BAXH (IV SOLUTION) ×1 IMPLANT
KIT TURNOVER CYSTO (KITS) ×3 IMPLANT
PACK CYSTO AR (MISCELLANEOUS) ×3 IMPLANT
SET CYSTO W/LG BORE CLAMP LF (SET/KITS/TRAYS/PACK) ×3 IMPLANT
SURGILUBE 2OZ TUBE FLIPTOP (MISCELLANEOUS) ×3 IMPLANT
SYR 10ML LL (SYRINGE) ×3 IMPLANT
SYRINGE IRR TOOMEY STRL 70CC (SYRINGE) IMPLANT
WATER STERILE IRR 1000ML POUR (IV SOLUTION) ×3 IMPLANT

## 2018-11-29 NOTE — Anesthesia Postprocedure Evaluation (Signed)
Anesthesia Post Note  Patient: Victor Mcgee  Procedure(s) Performed: RADIOACTIVE SEED IMPLANT/BRACHYTHERAPY IMPLANT (N/A )  Patient location during evaluation: PACU Anesthesia Type: General Level of consciousness: awake and alert and oriented Pain management: pain level controlled Vital Signs Assessment: post-procedure vital signs reviewed and stable Respiratory status: spontaneous breathing Cardiovascular status: blood pressure returned to baseline Anesthetic complications: no     Last Vitals:  Vitals:   11/29/18 0930 11/29/18 0951  BP: 124/74 139/82  Pulse: 66 63  Resp: 14 16  Temp: (!) 36.1 C 36.4 C  SpO2: 99% 100%    Last Pain:  Vitals:   11/29/18 0951  TempSrc: Oral  PainSc: 0-No pain                 Bentleigh Stankus

## 2018-11-29 NOTE — Interval H&P Note (Signed)
History and Physical Interval Note:  11/29/2018 7:31 AM  Victor Mcgee  has presented today for surgery, with the diagnosis of Prostate cancer  The various methods of treatment have been discussed with the patient and family. After consideration of risks, benefits and other options for treatment, the patient has consented to  Procedure(s): RADIOACTIVE SEED IMPLANT/BRACHYTHERAPY IMPLANT (N/A) as a surgical intervention .  The patient's history has been reviewed, patient examined, no change in status, stable for surgery.  I have reviewed the patient's chart and labs.  Questions were answered to the patient's satisfaction.    RRR CTAB  Hollice Espy

## 2018-11-29 NOTE — Anesthesia Procedure Notes (Signed)
Procedure Name: Intubation Date/Time: 11/29/2018 8:03 AM Performed by: Lowry Bowl, CRNA Pre-anesthesia Checklist: Patient identified, Emergency Drugs available, Suction available and Patient being monitored Patient Re-evaluated:Patient Re-evaluated prior to induction Oxygen Delivery Method: Circle system utilized Preoxygenation: Pre-oxygenation with 100% oxygen Induction Type: IV induction and Cricoid Pressure applied Ventilation: Mask ventilation without difficulty Laryngoscope Size: Mac and 4 Grade View: Grade II Tube type: Oral Tube size: 7.5 mm Number of attempts: 1 Airway Equipment and Method: Stylet Placement Confirmation: ETT inserted through vocal cords under direct vision,  positive ETCO2 and breath sounds checked- equal and bilateral Secured at: 23 cm Tube secured with: Tape Dental Injury: Teeth and Oropharynx as per pre-operative assessment

## 2018-11-29 NOTE — OR Nursing (Signed)
16FR Coude cath inserted as ordered (verbal from OR room) after c/o bladder discomfort.  Placed by Candace Haislip.  Pt with much relief after done.  Per MD, have nurse in office remove in 3 days.  Discussed with office, will remove on 12/02/18 at 9:30 am.

## 2018-11-29 NOTE — Anesthesia Preprocedure Evaluation (Signed)
Anesthesia Evaluation  Patient identified by MRN, date of birth, ID band Patient awake    Reviewed: Allergy & Precautions, NPO status , Patient's Chart, lab work & pertinent test results  Airway Mallampati: III       Dental  (+) Teeth Intact   Pulmonary COPD, former smoker,     + decreased breath sounds      Cardiovascular Exercise Tolerance: Good hypertension, Pt. on medications and Pt. on home beta blockers  Rhythm:Regular  Vaso vagal in Surgeon's office   Neuro/Psych MS  Short term memory loss  Neuromuscular disease    GI/Hepatic Neg liver ROS, GERD  Medicated,  Endo/Other  diabetes, Type 2  Renal/GU negative Renal ROS     Musculoskeletal   Abdominal Normal abdominal exam  (+)   Peds  Hematology   Anesthesia Other Findings   Reproductive/Obstetrics                             Anesthesia Physical  Anesthesia Plan  ASA: III  Anesthesia Plan: Regional   Post-op Pain Management:    Induction: Intravenous  PONV Risk Score and Plan:   Airway Management Planned: Oral ETT  Additional Equipment:   Intra-op Plan:   Post-operative Plan: Extubation in OR  Informed Consent: I have reviewed the patients History and Physical, chart, labs and discussed the procedure including the risks, benefits and alternatives for the proposed anesthesia with the patient or authorized representative who has indicated his/her understanding and acceptance.     Plan Discussed with: CRNA and Surgeon  Anesthesia Plan Comments:         Anesthesia Quick Evaluation

## 2018-11-29 NOTE — Discharge Instructions (Addendum)
Brachytherapy for Prostate Cancer, Care After °Refer to this sheet in the next few weeks. These instructions provide you with information on caring for yourself after your procedure. Your health care provider may also give you more specific instructions. Your treatment has been planned according to current medical practices, but problems sometimes occur. Call your health care provider if you have any problems or questions after your procedure. °What can I expect after the procedure? °The area behind the scrotum will probably be tender and bruised. For a short period of time you may have: °· Difficulty passing urine. You may need a catheter for a few days to a month. °· Blood in the urine or semen. °· A feeling of constipation because of prostate swelling. °· Frequent feeling of an urgent need to urinate. ° °For a long period of time you may have: °· Inflammation of the rectum. This happens in about 2% of people who have the procedure. °· Erection problems. These vary with age and occur in about 15-40% of men. °· Difficulty urinating. This is caused by scarring in the urethra. °· Diarrhea. ° °Follow these instructions at home: °· Take medicines only as directed by your health care provider. °· You will probably have a catheter in your bladder for several days. You will have blood in the urine bag and should drink a lot of fluids to keep it a light red color. °· Keep all follow-up visits as directed by your health care provider. If you have a catheter, it will be removed during one of these visits. °· Try not to sit directly on the area behind the scrotum. A soft cushion can decrease the discomfort. Ice packs may also be helpful for the discomfort. Do not put ice directly on the skin. °· Shower and wash the area behind the scrotum gently. Do not sit in a tub. °· If you have had the brachytherapy that uses the seeds, limit your close contact with children and pregnant women for 2 months because of the radiation still  in the prostate. After that period of time, the levels drop off quickly. °Get help right away if: °· You have a fever. °· You have chills. °· You have shortness of breath. °· You have chest pain. °· You have thick blood, like tomato juice, in the urine bag. °· Your catheter is blocked so urine cannot get into the bag. Your bladder area or lower abdomen may be swollen. °· There is excessive bleeding from your rectum. It is normal to have a little blood mixed with your stool. °· There is severe discomfort in the treated area that does not go away with pain medicine. °· You have abdominal discomfort. °· You have severe nausea or vomiting. °· You develop any new or unusual symptoms. °This information is not intended to replace advice given to you by your health care provider. Make sure you discuss any questions you have with your health care provider. °Document Released: 01/10/2011 Document Revised: 05/21/2016 Document Reviewed: 05/31/2013 °Elsevier Interactive Patient Education © 2017 [employer] ° ° ° °AMBULATORY SURGERY  °DISCHARGE INSTRUCTIONS ° ° °1) The drugs that you were given will stay in your system until tomorrow so for the next 24 hours you should not: ° °A) Drive an automobile °B) Make any legal decisions °C) Drink any alcoholic beverage ° ° °2) You may resume regular meals tomorrow.  Today it is better to start with liquids and gradually work up to solid foods. ° °You may eat anything   you prefer, but it is better to start with liquids, then soup and crackers, and gradually work up to solid foods. ° ° °3) Please notify your doctor immediately if you have any unusual bleeding, trouble breathing, redness and pain at the surgery site, drainage, fever, or pain not relieved by medication. ° ° ° °4) Additional Instructions: ° ° ° ° ° ° ° °Please contact your physician with any problems or Same Day Surgery at [phone], Monday through Friday 6 am to 4 pm, or [facility] number at  [phone]. °

## 2018-11-29 NOTE — Anesthesia Post-op Follow-up Note (Signed)
Anesthesia QCDR form completed.        

## 2018-11-29 NOTE — OR Nursing (Signed)
Foley to leg bag at discharge.  Leg bag sent home for night use.  Instructions given to family,

## 2018-11-29 NOTE — OR Nursing (Signed)
Post 50cc void = 433 ml; will discuss with MD when she's out of surgery.

## 2018-11-29 NOTE — Op Note (Signed)
11/29/18  Preoperative diagnosis: Adenocarcinoma of the prostate   Postoperative diagnosis: Same   Procedure: I-125 prostate seed implantation, cystoscopy  Surgeon: Hollice Espy M.D. ,   Radiation Oncologist: Lavena Stanford, M.D.   Anesthesia: General  Drains: none  Complications: none  Indications: Prostate cancer  Procedure: Patient was brought to operating suite and placement table in the supine position. At this time, a universal timeout protocol was performed, all team members were identified, Venodyne boots are placed, and he was administered IV Ancef in the preoperative period. He was placed in lithotomy position and prepped and draped in usual manner. Radiation oncology department placed a transrectal ultrasound probe anchoring stand/ grid and aligned with previous imaging from the volume study. Foley catheter was inserted without difficulty.  All needle passage was done with real-time transrectal ultrasound guidance in both the transverse and sagittal plains in order to achieve the desired preplanned position. A total of 31 needles were placed.  99 active seeds were implanted. The Foley catheter was removed and a rigid cystoscopy failed to show any seeds outside the prostate without evidence of trauma to the urethral, prostatic fossa, or bladder.  The bladder was drained.  A fluoroscopic image was then obtained showing excellent distrubution of the brachytherapy seeds.  Each seed was counted and counts were correct.    The patient was then repositioned in the supine position, reversed from anesthesia, and taken to the PACU in stable condition.

## 2018-11-29 NOTE — Transfer of Care (Signed)
Immediate Anesthesia Transfer of Care Note  Patient: Victor Mcgee  Procedure(s) Performed: RADIOACTIVE SEED IMPLANT/BRACHYTHERAPY IMPLANT (N/A )  Patient Location: PACU  Anesthesia Type:General  Level of Consciousness: awake, oriented, drowsy and patient cooperative  Airway & Oxygen Therapy: Patient Spontanous Breathing  Post-op Assessment: Report given to RN, Post -op Vital signs reviewed and stable and Patient moving all extremities  Post vital signs: Reviewed and stable  Last Vitals:  Vitals Value Taken Time  BP 142/88 11/29/2018  9:07 AM  Temp    Pulse 74 11/29/2018  9:10 AM  Resp 19 11/29/2018  9:10 AM  SpO2 97 % 11/29/2018  9:10 AM  Vitals shown include unvalidated device data.  Last Pain:  Vitals:   11/29/18 0615  TempSrc: Tympanic  PainSc: 0-No pain         Complications: No apparent anesthesia complications

## 2018-11-30 ENCOUNTER — Encounter: Payer: Self-pay | Admitting: Urology

## 2018-12-02 ENCOUNTER — Ambulatory Visit (INDEPENDENT_AMBULATORY_CARE_PROVIDER_SITE_OTHER): Payer: Medicare Other

## 2018-12-02 DIAGNOSIS — C61 Malignant neoplasm of prostate: Secondary | ICD-10-CM

## 2018-12-02 LAB — BLADDER SCAN AMB NON-IMAGING

## 2018-12-02 NOTE — Progress Notes (Signed)
Catheter Removal  Patient is present today for a catheter removal.  2ml of water was drained from the balloon. A 16FR foley cath was removed from the bladder no complications were noted . Patient tolerated well.  Preformed by: Gordy Clement, CMA (AAMA)    Fill and Pull Catheter Removal  Patient is present today for a catheter removal.  Patient was cleaned and prepped in a sterile fashion 258ml of sterile water/ saline was instilled into the bladder when the patient felt the urge to urinate, after feeling the urge to urinate urine began to come out around the catheter through the penis. 51ml of water was then drained from the balloon.  A 16FR foley cath was removed from the bladder no complications were noted . Advised pt to go home push fluids and RTC at 3pm. Patient returned at 3pm, PVR showed 54mL. Patient has been able to void on his own multiple times.  Preformed by: Gordy Clement, CMA (AAMA)  Follow up/ Additional notes: As scheduled for post op

## 2018-12-29 ENCOUNTER — Other Ambulatory Visit: Payer: Self-pay

## 2018-12-29 ENCOUNTER — Ambulatory Visit
Admission: RE | Admit: 2018-12-29 | Discharge: 2018-12-29 | Disposition: A | Discharge status: Home / Self Care | Payer: Medicare Other | Source: Ambulatory Visit | Attending: Radiation Oncology | Admitting: Radiation Oncology

## 2018-12-29 ENCOUNTER — Encounter: Payer: Self-pay | Admitting: Radiation Oncology

## 2018-12-29 ENCOUNTER — Other Ambulatory Visit: Payer: Self-pay | Admitting: *Deleted

## 2018-12-29 VITALS — BP 141/78 | HR 55 | Temp 95.2°F | Resp 16

## 2018-12-29 DIAGNOSIS — Z923 Personal history of irradiation: Secondary | ICD-10-CM | POA: Diagnosis not present

## 2018-12-29 DIAGNOSIS — C61 Malignant neoplasm of prostate: Secondary | ICD-10-CM

## 2018-12-29 DIAGNOSIS — G8220 Paraplegia, unspecified: Secondary | ICD-10-CM | POA: Insufficient documentation

## 2018-12-29 DIAGNOSIS — G35 Multiple sclerosis: Secondary | ICD-10-CM | POA: Insufficient documentation

## 2018-12-29 NOTE — Progress Notes (Signed)
Radiation Oncology Follow up Note  Name: Victor Mcgee   Date:   12/29/2018 MRN:  [id number] DOB: 1951/10/12    This 68 y.o. 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 Seen today in follow-up one month out he is doing fairly well specifically denies any diarrhea. He does have some decreased urinary stream. He otherwise is without complaint.Marland Kitchenwe perform CT scan for quality assuranceof his source placements today  COMPLICATIONS OF TREATMENT: none  FOLLOW UP COMPLIANCE: keeps appointments   PHYSICAL EXAM:  BP (!) 141/78 (BP Location: Left Arm, Patient Position: Sitting)   Pulse (!) 55   Temp (!) 95.2 F (35.1 C) (Tympanic)   Resp 16  Well-developed well-nourished patient in NAD. HEENT reveals PERLA, EOMI, discs not visualized.  Oral cavity is clear. No oral mucosal lesions are identified. Neck is clear without evidence of cervical or supraclavicular adenopathy. Lungs are clear to A&P. Cardiac examination is essentially unremarkable with regular rate and rhythm without murmur rub or thrill. Abdomen is benign with no organomegaly or masses noted. Motor sensory and DTR levels are equal and symmetric in the upper and lower extremities. Cranial nerves II through XII are grossly intact. Proprioception is intact. No peripheral adenopathy or edema is identified. No motor or sensory levels are noted. Crude visual fields are within normal range.  RADIOLOGY RESULTS: CT scan for quality assurance to be analyzed initial review shows excellent source placement  PLAN: present time patient is  doing well with minimal side effects from his I-125 interstitial implant. I'm please was overall progress. I've assured him his urinary symptoms will improve over the next month or 2 once the implant is decayed. I've asked to see him back in 3-4 months of Lupron a PSA at that time. Patient family know to call with any concerns. I would like to take this opportunity to thank you for allowing me to participate in the care of your patient.Noreene Filbert, MD

## 2019-01-11 ENCOUNTER — Ambulatory Visit: Payer: Medicare Other | Admitting: Urology

## 2019-01-12 ENCOUNTER — Encounter: Payer: Self-pay | Admitting: Urology

## 2019-01-12 DIAGNOSIS — C61 Malignant neoplasm of prostate: Secondary | ICD-10-CM | POA: Diagnosis not present

## 2019-01-13 DIAGNOSIS — C61 Malignant neoplasm of prostate: Secondary | ICD-10-CM | POA: Diagnosis not present

## 2019-02-02 ENCOUNTER — Encounter: Payer: Self-pay | Admitting: Urology

## 2019-02-02 ENCOUNTER — Ambulatory Visit: Payer: Medicare Other | Admitting: Urology

## 2019-02-02 VITALS — BP 115/74 | HR 60 | Ht 77.0 in | Wt 265.0 lb

## 2019-02-02 DIAGNOSIS — N138 Other obstructive and reflux uropathy: Secondary | ICD-10-CM

## 2019-02-02 DIAGNOSIS — N3941 Urge incontinence: Secondary | ICD-10-CM | POA: Diagnosis not present

## 2019-02-02 DIAGNOSIS — N401 Enlarged prostate with lower urinary tract symptoms: Secondary | ICD-10-CM | POA: Diagnosis not present

## 2019-02-02 DIAGNOSIS — R339 Retention of urine, unspecified: Secondary | ICD-10-CM

## 2019-02-02 DIAGNOSIS — C61 Malignant neoplasm of prostate: Secondary | ICD-10-CM

## 2019-02-02 LAB — BLADDER SCAN AMB NON-IMAGING

## 2019-02-02 MED ORDER — LEUPROLIDE ACETATE (3 MONTH) 22.5 MG IM KIT
22.5000 mg | PACK | Freq: Once | INTRAMUSCULAR | Status: AC
  Administered 2019-02-02: 22.5 mg via INTRAMUSCULAR

## 2019-02-02 NOTE — Progress Notes (Signed)
02/02/2019 11:23 AM   Victor Mcgee [birth year] [id number]  Referring provider: Glendon Axe, MD [facility] [place], [place] [address]  Chief Complaint  Patient presents with  . Prostate Cancer    follow up    HPI: Victor Mcgee is a 68 y.o. male with prostate cancer who had I-125 radioactive seeds placed on 11/2018 and is on ADT.  Patient had previously been on active surveillance for low risk prostate cancer more recently with a rising PSA.  After his PSA had risen to 13 as of 08/2017, 14 on 10/2017, and as high as 23.5 on 04/2018 and in light of multiple previous biopsies, he underwent an MRI of the prostate showing a PI-RADS 5 lesion involving the right basilar anterior peripheral zone, right mid gland anteriorly as well as posterior lateral peripheral zone, right basilar anterior transition zone and likely anterior stroma on the right.  There is a bulge in the vicinity of the capsule concerning for possible local extraprostatic extension.  There is no evidence of seminal vesicle involvement or pelvic lymphadenopathy.  He underwent prostate fusion biopsy (cognitive fusion) on 07/08/2018 by Dr. Jeffie Pollock at [facility] urology.  This revealed multiple cores of intermediate risk prostate cancer, Gleason 4+3 with evidence of perineural invasion and approximately 80% of Gleason pattern 4 in the area of interest.  In addition to this, he had one additional intermediate risk score of 3+4 at the right mid lateral prostate as well as additional cores of Gleason 3+3.    He does have multiple medical comorbidities including MS with resulting incomplete paraplegia, attention and short-term memory issues amongst others.  He has mobility and transportation issues which has made timely follow-up difficult at times.  Today he is also experiencing incomplete bladder emptying, his PVR is 351 mL.  Patient reports urinary frequency, hesitation, and does not feel like he is voiding  completely.  Patient denies dysuria.  Patient is voiding.  Patient reports that the hot flashes secondary to ADT are not bothersome.  Patient is due for a Lupron injection today.  PSA Trend  01/25/2016, 10.4  07/11/2016,  8.8  11/12/2016,  9.9  08/31/2017, 13.0  10/23/2017, 14.6  04/30/2018, 23.5  PMH: Past Medical History:  Diagnosis Date  . Acid reflux 01/25/2016  . BP (high blood pressure) 01/25/2016  . Controlled type 2 diabetes mellitus without complication ([place]) 03/25/9674  . Elevated prostate specific antigen (PSA) 01/25/2016  . HLD (hyperlipidemia) 01/25/2016  . Incomplete paraplegia ([place]) 07/18/2014  . Multiple sclerosis ([place]) 01/25/2016    Surgical History: Past Surgical History:  Procedure Laterality Date  . none    . PROSTATE BIOPSY N/A 02/04/2017   Procedure: TRANSRECTAL PROSTATE BIOPSY;  Surgeon: Victor Espy, MD;  Location: [facility] ORS;  Service: Urology;  Laterality: N/A;  . RADIOACTIVE SEED IMPLANT N/A 11/29/2018   Procedure: RADIOACTIVE SEED IMPLANT/BRACHYTHERAPY IMPLANT;  Surgeon: Victor Espy, MD;  Location: [facility] ORS;  Service: Urology;  Laterality: N/A;    Home Medications:  Allergies as of 02/02/2019   No Known Allergies     Medication List       Accurate as of February 02, 2019 11:23 AM. Always use your most recent med list.        baclofen 10 MG tablet Commonly known as:  LIORESAL Take 10 mg by mouth 2 (two) times daily.   hydrochlorothiazide 12.5 MG capsule Commonly known as:  MICROZIDE Take 12.5 mg by mouth daily.   HYDROcodone-acetaminophen 5-325 MG tablet Commonly known  as:  NORCO/VICODIN Take 1-2 tablets by mouth every 6 (six) hours as needed for moderate pain.   lisinopril 10 MG tablet Commonly known as:  PRINIVIL,ZESTRIL Take 10 mg by mouth every evening.   memantine 10 MG tablet Commonly known as:  NAMENDA Take 10 mg by mouth 2 (two) times daily.   metoprolol tartrate 50 MG tablet Commonly known as:  LOPRESSOR Take 50 mg by mouth  2 (two) times daily.   tamsulosin 0.4 MG Caps capsule Commonly known as:  FLOMAX Take 1 capsule (0.4 mg total) by mouth daily.   Transport Chair Misc One power wheelchair for multiple sclerosis with leg weakness.   ZETIA 10 MG tablet Generic drug:  ezetimibe Take 10 mg by mouth daily.       Allergies: No Known Allergies  Family History: Family History  Problem Relation Age of Onset  . Kidney failure Mother   . Kidney disease Mother   . Esophageal cancer Brother   . Prostate cancer Brother   . Colon cancer Brother   . Lung cancer Father   . Cancer Brother        Abdominal  . Kidney disease Brother   . Kidney disease Brother   . Lung cancer Sister     Social History:  reports that he quit smoking about 26 years ago. He has never used smokeless tobacco. He reports that he does not drink alcohol or use drugs.  ROS: UROLOGY Frequent Urination?: Yes Hard to postpone urination?: Yes Burning/pain with urination?: Yes Get up at night to urinate?: Yes Leakage of urine?: Yes Urine stream starts and stops?: Yes Trouble starting stream?: Yes Do you have to strain to urinate?: Yes Blood in urine?: No Urinary tract infection?: No Sexually transmitted disease?: No Injury to kidneys or bladder?: No Painful intercourse?: No Weak stream?: No Erection problems?: No Penile pain?: No  Gastrointestinal Nausea?: No Vomiting?: No Indigestion/heartburn?: No Diarrhea?: No Constipation?: No  Constitutional Fever: No Night sweats?: No Weight loss?: No Fatigue?: No  Skin Skin rash/lesions?: No Itching?: No  Eyes Blurred vision?: No Double vision?: No  Ears/Nose/Throat Sore throat?: No Sinus problems?: No  Hematologic/Lymphatic Swollen glands?: No Easy bruising?: No  Cardiovascular Leg swelling?: Yes Chest pain?: No  Respiratory Cough?: No Shortness of breath?: No  Endocrine Excessive thirst?: No  Musculoskeletal Back pain?: Yes Joint pain?:  No  Neurological Headaches?: No Dizziness?: No  Psychologic Depression?: Yes Anxiety?: No  Physical Exam: BP 115/74   Pulse 60   Ht 6\' 5"  (1.956 m)   Wt 265 lb (120.2 kg)   BMI 31.42 kg/m   Constitutional:  Well nourished. Alert and oriented, No acute distress.  Patient is accompanied by family.  Wheelchair. Cardiovascular: No clubbing, cyanosis, or edema. Respiratory: Normal respiratory effort, no increased work of breathing. Skin: No rashes, bruises or suspicious lesions. Neurologic: Lower extremity weakness appreciated. Psychiatric: Normal mood and affect.  Laboratory Data: Lab Results  Component Value Date   WBC 8.1 11/22/2018   HGB 13.7 11/22/2018   HCT 42.4 11/22/2018   MCV 90.2 11/22/2018   PLT 265 11/22/2018    Lab Results  Component Value Date   CREATININE 1.21 11/22/2018   Results for STEPHANO, ARRANTS (MRN [id number]) as of 02/02/2019 08:10  Ref. Range 01/25/2016 11:35 07/11/2016 10:58 11/12/2016 10:27 08/31/2017 11:14 10/23/2017 10:42 04/30/2018 09:46  Prostate Specific Ag, Serum Latest Ref Range: 0.0 - 4.0 ng/mL 10.4 (H) 8.8 (H) 9.9 (H) 13.0 (H) 14.6 (H) 23.5 (H)   Urinalysis  Component Value Date/Time   APPEARANCEUR Clear 01/25/2016 1135   GLUCOSEU Negative 01/25/2016 1135   BILIRUBINUR Negative 01/25/2016 1135   PROTEINUR 2+ (A) 01/25/2016 1135   NITRITE Negative 01/25/2016 1135   LEUKOCYTESUR Negative 01/25/2016 1135    Lab Results  Component Value Date   LABMICR See below: 01/25/2016   WBCUA 0-5 01/25/2016   RBCUA 0-2 01/25/2016   LABEPIT 0-10 01/25/2016   BACTERIA None seen 01/25/2016   Relevant Images:  Results for orders placed or performed in visit on 02/02/19  BLADDER SCAN AMB NON-IMAGING  Result Value Ref Range   Scan Result 373ml     Assessment & Plan:    1. Prostate cancer ([place]) - s/p seed implanatation - Given his remarkably elevated PSA, patient is high-risk and will do ADT for 2 years - Lupron given today, 3 month depo -  Patient will return in 3 months for Lupron injection ( will transition to 6 month depo in order to limit the amount of transportation needed which has been a barrier to care)  2.  Incomplete bladder emptying/BPH with obstruction - Explained to patient the risks of urinary retention, and the options at this point - Catheterization, both chronic indwelling as well as self cath is undesirable with this patient - At this point, surveillance is the best option and patient is agreeable -Continue Flomax - Discussed with patient and his family the symptoms of the patient going into retention - Patient will return in 3 months for PVR  Return in about 3 months (around 05/03/2019) for Lupron and PVR.  [place] [address], [place], [place] [address] [phone]  I, Adele Schilder, am acting as a scribe for Victor Espy, MD   I have reviewed the above documentation for accuracy and completeness, and I agree with the above.   Victor Espy, MD

## 2019-02-02 NOTE — Progress Notes (Signed)
Lupron IM Injection   Due to Prostate Cancer patient is present today for a Lupron Injection.  Medication: Lupron 3 month Dose: 22.5 mg  Location: left upper outer buttocks Lot: [id number] Exp: 05/30/2021  Patient tolerated well, no complications were noted  Performed by: Fonnie Jarvis, CMA  Follow up: 71month

## 2019-02-25 ENCOUNTER — Telehealth: Payer: Self-pay | Admitting: Urology

## 2019-02-25 NOTE — Telephone Encounter (Signed)
Pt's sister called and states that pt has rash and she was wondering if it could be coming from the seed implant that was done in December 2019. I spoke with Dr Bernardo Heater and he said that it most likely was not coming from that and to follow up with his PCP.

## 2019-03-23 ENCOUNTER — Other Ambulatory Visit: Payer: Medicare Other

## 2019-03-30 ENCOUNTER — Ambulatory Visit: Payer: Medicare Other | Admitting: Radiation Oncology

## 2019-04-27 ENCOUNTER — Inpatient Hospital Stay: Payer: Medicare Other

## 2019-05-04 ENCOUNTER — Ambulatory Visit: Payer: Medicare Other | Admitting: Urology

## 2019-05-04 ENCOUNTER — Ambulatory Visit: Payer: Medicare Other | Admitting: Radiation Oncology

## 2019-10-18 ENCOUNTER — Ambulatory Visit: Payer: Medicare Other | Admitting: Urology

## 2019-10-19 ENCOUNTER — Ambulatory Visit: Payer: Medicare Other | Admitting: Urology

## 2019-10-19 ENCOUNTER — Telehealth: Payer: Self-pay | Admitting: Urology

## 2019-10-19 NOTE — Telephone Encounter (Signed)
Patient's sister cx his app today she stated that she did not have a way to get him to the app. I explained that he has already missed his app for his lupron and that he really needed to come in as soon as he could to get it. She said he was to follow up at the cancer center too, not sure when that is. She stated she would try to call back to bring him in soon to get that done.   Sharyn Lull

## 2019-10-28 IMAGING — NM NM BONE WHOLE BODY
2 series · 6 of 6 positions shown · non-contrast
Comparison: None

Radiographic correlation: MR pelvis 06/04/2018

CLINICAL DATA: Prostate cancer, recent rise in PSA = 23

EXAM:
NUCLEAR MEDICINE WHOLE BODY BONE SCAN
TECHNIQUE: Whole body anterior and posterior images were obtained approximately
3 hours after intravenous injection of radiopharmaceutical.
RADIOPHARMACEUTICALS:  23.72 mCi Iechnetium-CCm MDP IV

[Series 1000: 3 hr wholebody · 2.40mm/px · 2 of 2 frames shown]
[frame 1/2]
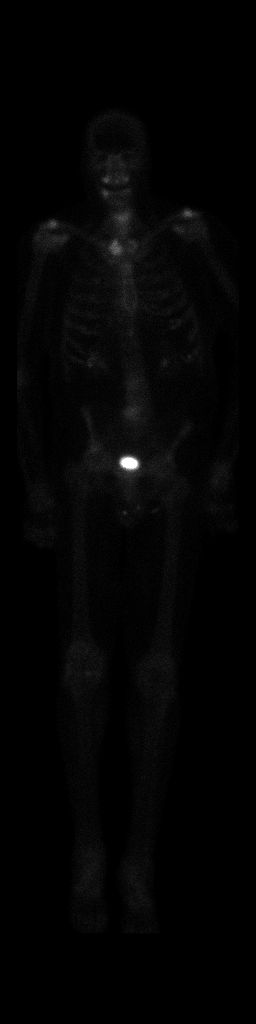
[frame 2/2]
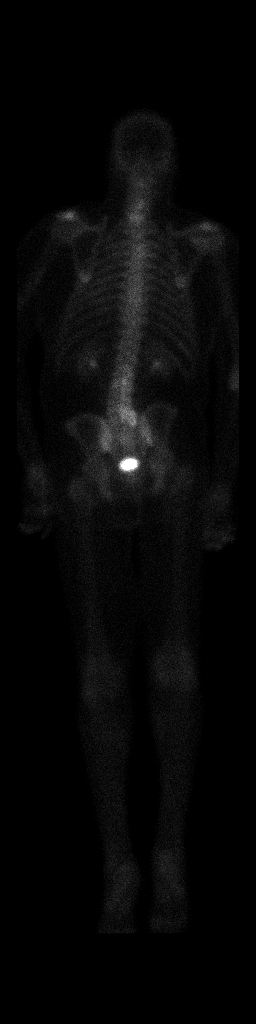

[Series 1000: statics · 2.40mm/px · 2 acquisitions, 4 frames shown]
[im 1/2]
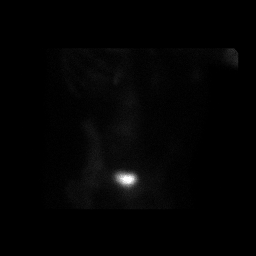
[im 1/2]
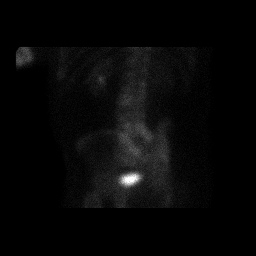
[im 2/2]
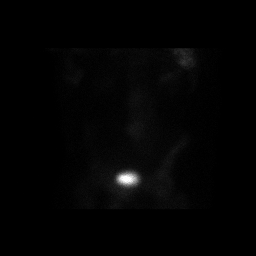
[im 2/2]
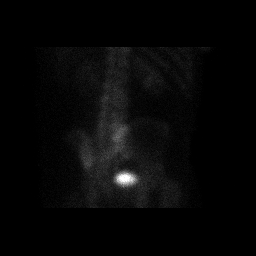

[6 of 6 positions shown; findings below may reference images not displayed]

FINDINGS: Increased tracer accumulation at the AC joints and shoulders
bilaterally, hips, RIGHT knee, and BILATERAL sternoclavicular joints
RIGHT greater than LEFT, typically degenerative.

Uptake at the mandible and maxilla on RIGHT likely related to dental
disease.

Scattered costal cartilaginous uptake.

Significant increased tracer accumulation in the lower lumbar spine
on RIGHT at L5-S1, nonspecific; this could be related to
degenerative changes though a RIGHT L5 metastatic lesion is
completely not excluded.

No additional abnormal osseous tracer accumulation is identified.

Expected urinary tract and soft tissue distribution of tracer.
IMPRESSION: Scattered degenerative type uptake as above.

Additional abnormal increased tracer localization at RIGHT L5-S1,
potentially degenerative but metastatic disease is not excluded;
further evaluation by initially radiographs recommended.

## 2019-11-08 ENCOUNTER — Other Ambulatory Visit: Payer: Self-pay

## 2019-11-08 ENCOUNTER — Encounter: Payer: Self-pay | Admitting: Urology

## 2019-11-08 ENCOUNTER — Ambulatory Visit (INDEPENDENT_AMBULATORY_CARE_PROVIDER_SITE_OTHER): Payer: Medicare Other | Admitting: Urology

## 2019-11-08 VITALS — BP 162/84 | HR 88 | Ht 77.0 in | Wt 250.0 lb

## 2019-11-08 DIAGNOSIS — C61 Malignant neoplasm of prostate: Secondary | ICD-10-CM

## 2019-11-08 DIAGNOSIS — R339 Retention of urine, unspecified: Secondary | ICD-10-CM | POA: Diagnosis not present

## 2019-11-08 LAB — BLADDER SCAN AMB NON-IMAGING

## 2019-11-08 MED ORDER — LEUPROLIDE ACETATE (6 MONTH) 45 MG ~~LOC~~ KIT
45.0000 mg | PACK | Freq: Once | SUBCUTANEOUS | Status: AC
  Administered 2019-11-08: 45 mg via SUBCUTANEOUS

## 2019-11-08 NOTE — Progress Notes (Signed)
Eligard SubQ Injection   Due to Prostate Cancer patient is present today for a Eligard Injection.  Medication: Eligard 6 month Dose: 45 mg  Location: right lower abdominal Lot: [id number] Exp: 06/20/2021  Patient tolerated well, no complications were noted  Performed by: Verlene Mayer, [place]   Reminder continue on Vitamin D 800-1000iu and Calium 1000-1200mg  daily while on Androgen Deprivation Therapy.

## 2019-11-08 NOTE — Patient Instructions (Signed)
Take vitamin D and Calcium daily

## 2019-11-11 NOTE — Progress Notes (Signed)
11/08/2019 11:19 AM   Victor Mcgee 02-18-1951 [id number]  Referring provider: Glendon Axe, MD [address] [place],  [place] [address]  Chief Complaint  Patient presents with  . Prostate Cancer    HPI: 68 year old male with history of MS/mobility and transportation issues who returns today for follow-up he was last seen in 01/2019 is overdue for follow-up.  Cancer history as below.  He has a personal history of Gleason 4+3 prostate cancer as well as a PI-RADS 5 lesion on prostate MR with possible local extraprostatic extension. iPSA 23.5  Please see previous notes for details.  Due to his comorbidities as well as social issues including transportation and compliance, he ultimately underwent placement of I-125 brachytherapy seeds into the prostate with plans for adjuvant ADT x2 to 3 years for presumed high risk cancer based on his PSA.  He is overdue for his Lupron, last 4-month Depo given 01/2019.  He has been tolerating it well with minimal hot flashes.  He is nonambulatory has not been engaging in weightbearing exercises.  He is taking calcium and vitamin D supplements.  Most recent PSA available in care everywhere, 1.04 on 09/15/2019.  He reports no urinary issues although he has urgency and urge incontinence.  He wears a diaper.  Is not bothered by this.  He also has a personal history of incomplete bladder emptying.  His PVRs have been in the 200-300 range consistently.  No recent urinary tract infections.  Most recent creatinine 1.2, stable for the past 3+ years.   PMH: Past Medical History:  Diagnosis Date  . Acid reflux 01/25/2016  . BP (high blood pressure) 01/25/2016  . Controlled type 2 diabetes mellitus without complication ([place]) 99991111  . Elevated prostate specific antigen (PSA) 01/25/2016  . HLD (hyperlipidemia) 01/25/2016  . Incomplete paraplegia ([place]) 07/18/2014  . Multiple sclerosis ([place]) 01/25/2016    Surgical History: Past  Surgical History:  Procedure Laterality Date  . none    . PROSTATE BIOPSY N/A 02/04/2017   Procedure: TRANSRECTAL PROSTATE BIOPSY;  Surgeon: Hollice Espy, MD;  Location: [facility] ORS;  Service: Urology;  Laterality: N/A;  . RADIOACTIVE SEED IMPLANT N/A 11/29/2018   Procedure: RADIOACTIVE SEED IMPLANT/BRACHYTHERAPY IMPLANT;  Surgeon: Hollice Espy, MD;  Location: [facility] ORS;  Service: Urology;  Laterality: N/A;    Home Medications:  Allergies as of 11/08/2019   No Known Allergies     Medication List       Accurate as of November 08, 2019 11:59 PM. If you have any questions, ask your nurse or doctor.        STOP taking these medications   baclofen 10 MG tablet Commonly known as: LIORESAL Stopped by: Hollice Espy, MD     TAKE these medications   hydrochlorothiazide 12.5 MG capsule Commonly known as: MICROZIDE Take 12.5 mg by mouth daily.   HYDROcodone-acetaminophen 5-325 MG tablet Commonly known as: NORCO/VICODIN Take 1-2 tablets by mouth every 6 (six) hours as needed for moderate pain.   lisinopril 10 MG tablet Commonly known as: ZESTRIL Take 10 mg by mouth every evening.   memantine 10 MG tablet Commonly known as: NAMENDA Take 10 mg by mouth 2 (two) times daily.   metoprolol tartrate 50 MG tablet Commonly known as: LOPRESSOR Take 50 mg by mouth 2 (two) times daily.   tamsulosin 0.4 MG Caps capsule Commonly known as: Flomax Take 1 capsule (0.4 mg total) by mouth daily.   Programmer, applications One power wheelchair  for multiple sclerosis with leg weakness.   Zetia 10 MG tablet Generic drug: ezetimibe Take 10 mg by mouth daily.       Allergies: No Known Allergies  Family History: Family History  Problem Relation Age of Onset  . Kidney failure Mother   . Kidney disease Mother   . Esophageal cancer Brother   . Prostate cancer Brother   . Colon cancer Brother   . Lung cancer Father   . Cancer Brother        Abdominal  . Kidney disease Brother   .  Kidney disease Brother   . Lung cancer Sister     Social History:  reports that he quit smoking about 27 years ago. He has never used smokeless tobacco. He reports that he does not drink alcohol or use drugs.  ROS: UROLOGY Frequent Urination?: No Hard to postpone urination?: No Burning/pain with urination?: No Get up at night to urinate?: No Leakage of urine?: No Urine stream starts and stops?: No Trouble starting stream?: No Do you have to strain to urinate?: No Blood in urine?: No Urinary tract infection?: No Sexually transmitted disease?: No Injury to kidneys or bladder?: No Painful intercourse?: No Weak stream?: No Erection problems?: No Penile pain?: No  Gastrointestinal Nausea?: No Vomiting?: No Indigestion/heartburn?: No Diarrhea?: No Constipation?: No  Constitutional Fever: No Night sweats?: No Weight loss?: No Fatigue?: No  Skin Skin rash/lesions?: No Itching?: No  Eyes Blurred vision?: No Double vision?: No  Ears/Nose/Throat Sore throat?: No Sinus problems?: No  Hematologic/Lymphatic Swollen glands?: No Easy bruising?: No  Cardiovascular Leg swelling?: No Chest pain?: No  Respiratory Cough?: No Shortness of breath?: No  Endocrine Excessive thirst?: No  Musculoskeletal Back pain?: No Joint pain?: No  Neurological Headaches?: No Dizziness?: No  Psychologic Depression?: No Anxiety?: No  Physical Exam: BP (!) 162/84   Pulse 88   Ht 6\' 5"  (1.956 m)   Wt 250 lb (113.4 kg)   BMI 29.65 kg/m   Constitutional:  Alert and oriented, No acute distress.  In wheelchair.  Accompanied by sister today. HEENT: [place] AT, moist mucus membranes.  Trachea midline, no masses. Cardiovascular: No clubbing, cyanosis, or edema. Respiratory: Normal respiratory effort, no increased work of breathing. GI: Abdomen is soft, nontender, nondistended, no abdominal masses Skin: No rashes, bruises or suspicious lesions. Neurologic: Grossly intact, no focal  deficits, moving all 4 extremities. Psychiatric: Normal mood and affect.  Laboratory Data: Hbg 13.9  9/20 Cr 1.2  9/20 PSA 1.04  9/20   Pertinent Imaging: Results for orders placed or performed in visit on 11/08/19  Bladder Scan (Post Void Residual) in office  Result Value Ref Range   Scan Result 248LB     Assessment & Plan:    1. Prostate cancer [facility]) Status post I-125 brachyseed implantation 11/2018 + Lupron 2-3 years Unconventional treatment secondary to comorbidities and social issues 50-month Depo given today, reminded of bone health recommendations Most recent PSA appears to be fairly well controlled, checked just 2 months ago thus will defer repeat today Follow-up with radiation oncology in January scheduled for repeat PSA - leuprolide (6 Month) (ELIGARD) injection 45 mg  2. Incomplete bladder emptying Stably elevated PVR today, asymptomatic We will continue to follow however unable to self cath No obvious sequela including no recent infections with stable creatinine which is reassuring Continue Flomax - Bladder Scan (Post Void Residual) in office    Return in about 6 months (around 05/07/2020) for PSA/ luprolide.  Hollice Espy,  MD  [place] [address] [place], [place] [address] [phone]  I spent 25 min with this patient of which greater than 50% was spent in counseling and coordination of care with the patient.

## 2020-04-30 ENCOUNTER — Other Ambulatory Visit: Payer: Self-pay | Admitting: *Deleted

## 2020-04-30 ENCOUNTER — Telehealth: Payer: Self-pay | Admitting: Urology

## 2020-04-30 DIAGNOSIS — C61 Malignant neoplasm of prostate: Secondary | ICD-10-CM

## 2020-04-30 NOTE — Telephone Encounter (Signed)
Ms. Nicole Kindred called to cx upcoming lab and office visit appts.  States pt is in a facility in [place] and transportation has to be set up. Ms. Nicole Kindred did not wish to r/s at this time, states she will call back to r/s appts at a later time. - FYI

## 2020-05-01 ENCOUNTER — Other Ambulatory Visit: Payer: Medicare Other

## 2020-05-08 ENCOUNTER — Ambulatory Visit: Payer: Medicare Other | Admitting: Urology

## 2020-08-29 ENCOUNTER — Encounter (HOSPITAL_COMMUNITY): Payer: Self-pay

## 2020-08-29 ENCOUNTER — Other Ambulatory Visit: Payer: Self-pay

## 2020-08-29 ENCOUNTER — Emergency Department (HOSPITAL_COMMUNITY)
Admission: EM | Admit: 2020-08-29 | Discharge: 2020-08-29 | Disposition: A | Discharge status: Home / Self Care | Payer: Medicare Other | Attending: Emergency Medicine | Admitting: Emergency Medicine

## 2020-08-29 ENCOUNTER — Emergency Department (HOSPITAL_COMMUNITY): Payer: Medicare Other

## 2020-08-29 ENCOUNTER — Telehealth: Payer: Self-pay | Admitting: *Deleted

## 2020-08-29 DIAGNOSIS — E119 Type 2 diabetes mellitus without complications: Secondary | ICD-10-CM | POA: Insufficient documentation

## 2020-08-29 DIAGNOSIS — I10 Essential (primary) hypertension: Secondary | ICD-10-CM | POA: Insufficient documentation

## 2020-08-29 DIAGNOSIS — Z87891 Personal history of nicotine dependence: Secondary | ICD-10-CM | POA: Diagnosis not present

## 2020-08-29 DIAGNOSIS — Z79899 Other long term (current) drug therapy: Secondary | ICD-10-CM | POA: Insufficient documentation

## 2020-08-29 DIAGNOSIS — R531 Weakness: Secondary | ICD-10-CM | POA: Insufficient documentation

## 2020-08-29 DIAGNOSIS — M542 Cervicalgia: Secondary | ICD-10-CM | POA: Diagnosis not present

## 2020-08-29 LAB — COMPREHENSIVE METABOLIC PANEL
ALT: 21 U/L (ref 0–44)
AST: 20 U/L (ref 15–41)
Albumin: 3.8 g/dL (ref 3.5–5.0)
Alkaline Phosphatase: 74 U/L (ref 38–126)
Anion gap: 10 (ref 5–15)
BUN: 17 mg/dL (ref 8–23)
CO2: 23 mmol/L (ref 22–32)
Calcium: 9.2 mg/dL (ref 8.9–10.3)
Chloride: 104 mmol/L (ref 98–111)
Creatinine, Ser: 1.28 mg/dL — ABNORMAL HIGH (ref 0.61–1.24)
GFR calc Af Amer: >60 mL/min (ref >60)
GFR calc non Af Amer: 58 mL/min — ABNORMAL LOW (ref >60)
Glucose, Bld: 97 mg/dL (ref 70–99)
Potassium: 3.8 mmol/L (ref 3.5–5.1)
Sodium: 137 mmol/L (ref 135–145)
Total Bilirubin: 0.6 mg/dL (ref 0.3–1.2)
Total Protein: 7.2 g/dL (ref 6.5–8.1)

## 2020-08-29 LAB — CBC WITH DIFFERENTIAL/PLATELET
Abs Immature Granulocytes: 0.05 10*3/uL (ref 0.00–0.07)
Basophils Absolute: 0 10*3/uL (ref 0.0–0.1)
Basophils Relative: 0 %
Eosinophils Absolute: 0.3 10*3/uL (ref 0.0–0.5)
Eosinophils Relative: 5 %
HCT: 38.7 % — ABNORMAL LOW (ref 39.0–52.0)
Hemoglobin: 12.5 g/dL — ABNORMAL LOW (ref 13.0–17.0)
Immature Granulocytes: 1 %
Lymphocytes Relative: 25 %
Lymphs Abs: 1.4 10*3/uL (ref 0.7–4.0)
MCH: 29.9 pg (ref 26.0–34.0)
MCHC: 32.3 g/dL (ref 30.0–36.0)
MCV: 92.6 fL (ref 80.0–100.0)
Monocytes Absolute: 0.5 10*3/uL (ref 0.1–1.0)
Monocytes Relative: 9 %
Neutro Abs: 3.4 10*3/uL (ref 1.7–7.7)
Neutrophils Relative %: 60 %
Platelets: 225 10*3/uL (ref 150–400)
RBC: 4.18 MIL/uL — ABNORMAL LOW (ref 4.22–5.81)
RDW: 14.2 % (ref 11.5–15.5)
WBC: 5.7 10*3/uL (ref 4.0–10.5)
nRBC: 0 % (ref 0.0–0.2)

## 2020-08-29 MED ORDER — TIZANIDINE HCL 4 MG PO TABS
2.0000 mg | ORAL_TABLET | Freq: Once | ORAL | Status: AC
  Administered 2020-08-29: 2 mg via ORAL
  Filled 2020-08-29: qty 1

## 2020-08-29 MED ORDER — TIZANIDINE HCL 2 MG PO CAPS: 2.0000 mg | ORAL_CAPSULE | Freq: Three times a day (TID) | ORAL | 0 refills | Status: DC

## 2020-08-29 MED ORDER — METHYL SALICYLATE-LIDO-MENTHOL 4-4-5 % EX PTCH: 1.0000 | MEDICATED_PATCH | Freq: Two times a day (BID) | CUTANEOUS | 0 refills | Status: AC

## 2020-08-29 MED ORDER — DICLOFENAC EPOLAMINE 1.3 % EX PTCH
1.0000 | MEDICATED_PATCH | Freq: Two times a day (BID) | CUTANEOUS | Status: DC
  Administered 2020-08-29: 1 via TRANSDERMAL
  Filled 2020-08-29: qty 1

## 2020-08-29 MED ORDER — METHYL SALICYLATE-LIDO-MENTHOL 4-4-5 % EX PTCH: 1.0000 | MEDICATED_PATCH | Freq: Two times a day (BID) | CUTANEOUS | 0 refills | Status: DC

## 2020-08-29 MED ORDER — TIZANIDINE HCL 2 MG PO CAPS: 2.0000 mg | ORAL_CAPSULE | Freq: Three times a day (TID) | ORAL | 0 refills | Status: AC

## 2020-08-29 NOTE — Telephone Encounter (Signed)
TOC CM received call from [facility], requesting meds be sent to [facility] in [place]. ED provider notified. Contacted facility with update. [place] [place] ED TOC CM [phone]

## 2020-08-29 NOTE — Discharge Instructions (Signed)
As discussed, today's evaluation has been generally reassuring. Your pain is likely due to musculoskeletal causes. Please use the provided medication as directed, and do not hesitate to return for concerning changes in your condition.

## 2020-08-29 NOTE — ED Triage Notes (Signed)
Pt arrived via EMS from [facility], c/o neck pain since last night. Denies any fall or injury. Denies any other sx.

## 2020-08-29 NOTE — ED Provider Notes (Signed)
[facility] DEPT Provider Note   CSN: [id number] Arrival date & time: 08/29/20  9629     History Chief Complaint  Patient presents with  . Neck Pain    Victor Mcgee is a 69 y.o. male.  HPI    Patient presents with neck pain. Patient has multiple medical issues include multiple sclerosis, paraplegia, has no history of neck surgery, no prior neck pain. He notes that yesterday, with activity developed pain, in the right paraspinal region Pain is worse with motion. No associated vision changes, nausea, vomiting, fever, chills, weakness in any extremity that is new. Patient also history of prostate cancer, though he requires reminding of this, and on chart review is clear the patient has a history of dementia as well, likely limiting his ability to provide long-term details of his history, level 5 caveat secondary to this, though history is obtained generally well as above.  Per EMS, and nursing home report the patient has had no relief despite using appropriate OTC medication, and cream. No report of fever, fall, trauma.  Past Medical History:  Diagnosis Date  . Acid reflux 01/25/2016  . BP (high blood pressure) 01/25/2016  . Controlled type 2 diabetes mellitus without complication ([place]) 04/22/8412  . Elevated prostate specific antigen (PSA) 01/25/2016  . HLD (hyperlipidemia) 01/25/2016  . Incomplete paraplegia ([place]) 07/18/2014  . Multiple sclerosis ([place]) 01/25/2016    Patient Active Problem List   Diagnosis Date Noted  . Elevated aspartate aminotransferase level 02/09/2016  . Essential (primary) hypertension 02/09/2016  . Pure hypercholesterolemia 02/09/2016  . Controlled type 2 diabetes mellitus without complication ([place]) 24/40/1027  . Elevated prostate specific antigen (PSA) 01/25/2016  . Acid reflux 01/25/2016  . HLD (hyperlipidemia) 01/25/2016  . BP (high blood pressure) 01/25/2016  . Multiple sclerosis ([place]) 01/25/2016  . Incomplete paraplegia ([place])  07/18/2014    Past Surgical History:  Procedure Laterality Date  . none    . PROSTATE BIOPSY N/A 02/04/2017   Procedure: TRANSRECTAL PROSTATE BIOPSY;  Surgeon: Hollice Espy, MD;  Location: [facility] ORS;  Service: Urology;  Laterality: N/A;  . RADIOACTIVE SEED IMPLANT N/A 11/29/2018   Procedure: RADIOACTIVE SEED IMPLANT/BRACHYTHERAPY IMPLANT;  Surgeon: Hollice Espy, MD;  Location: [facility] ORS;  Service: Urology;  Laterality: N/A;       Family History  Problem Relation Age of Onset  . Kidney failure Mother   . Kidney disease Mother   . Esophageal cancer Brother   . Prostate cancer Brother   . Colon cancer Brother   . Lung cancer Father   . Cancer Brother        Abdominal  . Kidney disease Brother   . Kidney disease Brother   . Lung cancer Sister     Social History   Tobacco Use  . Smoking status: Former Smoker    Quit date: 10/23/1992    Years since quitting: 27.8  . Smokeless tobacco: Never Used  Vaping Use  . Vaping Use: Never used  Substance Use Topics  . Alcohol use: No    Alcohol/week: 0.0 standard drinks  . Drug use: No    Home Medications Prior to Admission medications   Medication Sig Start Date End Date Taking? Authorizing Provider  [employer] VITAMIN B12 1000 MCG tablet Take 1,000 mcg by mouth daily. 03/12/20  Yes [provider]  ezetimibe (ZETIA) 10 MG tablet Take 10 mg by mouth daily.  01/17/16  Yes [provider]  hydrochlorothiazide (MICROZIDE) 12.5 MG capsule Take 12.5 mg by  mouth daily.  01/17/16  Yes [provider]  lisinopril (PRINIVIL,ZESTRIL) 10 MG tablet Take 10 mg by mouth daily.  01/17/16  Yes [provider]  memantine (NAMENDA) 10 MG tablet Take 10 mg by mouth 2 (two) times daily.   Yes [provider]  metoprolol (LOPRESSOR) 50 MG tablet Take 50 mg by mouth 2 (two) times daily.  01/17/16  Yes [provider]  polyethylene glycol (MIRALAX / GLYCOLAX) 17 g packet Take 17 g by mouth daily as needed for  mild constipation.   Yes [provider]  tamsulosin (FLOMAX) 0.4 MG CAPS capsule Take 1 capsule (0.4 mg total) by mouth daily. 11/29/18  Yes Hollice Espy, MD  HYDROcodone-acetaminophen (NORCO/VICODIN) 5-325 MG tablet Take 1-2 tablets by mouth every 6 (six) hours as needed for moderate pain. Patient not taking: Reported on 08/29/2020 11/29/18   Hollice Espy, MD  Methyl Salicylate-Lido-Menthol 4-4-5 % PTCH Apply 1 patch topically in the morning and at bedtime. 08/29/20   Carmin Muskrat, MD  Misc. Devices (TRANSPORT CHAIR) MISC One power wheelchair for multiple sclerosis with leg weakness. 01/04/16   [provider]  tizanidine (ZANAFLEX) 2 MG capsule Take 1 capsule (2 mg total) by mouth 3 (three) times daily for 3 days. 08/29/20 09/01/20  Carmin Muskrat, MD    Allergies    Patient has no known allergies.  Review of Systems   Review of Systems  Constitutional:       Per HPI, otherwise negative  HENT:       Per HPI, otherwise negative  Respiratory:       Per HPI, otherwise negative  Cardiovascular:       Per HPI, otherwise negative  Gastrointestinal: Negative for vomiting.  Endocrine:       Negative aside from HPI  Genitourinary:       Neg aside from HPI   Musculoskeletal:       Per HPI, otherwise negative  Skin: Negative.   Allergic/Immunologic: Negative for immunocompromised state.  Neurological: Positive for weakness. Negative for syncope.  Psychiatric/Behavioral:       Per HPI, dementia    Physical Exam Updated Vital Signs BP (!) 83/63 (BP Location: Left Arm)   Pulse 68   Temp 98.3 F (36.8 C) (Oral)   Resp 16   SpO2 98%   Physical Exam Vitals and nursing note reviewed.  Constitutional:      Appearance: He is well-developed. He is ill-appearing.  HENT:     Head: Normocephalic and atraumatic.  Eyes:     Conjunctiva/sclera: Conjunctivae normal.  Neck:   Cardiovascular:     Rate and Rhythm: Normal rate and regular rhythm.  Pulmonary:      Effort: Pulmonary effort is normal. No respiratory distress.     Breath sounds: No stridor.  Abdominal:     General: There is no distension.  Skin:    General: Skin is warm and dry.  Neurological:     Mental Status: He is alert.     Comments: Upper extremity strength 5/5 bilateral, symmetric, proximal, distal. Lower extremity paraplegia, no change. Face is symmetric, speech is clear, brief.  Psychiatric:        Cognition and Memory: Memory is impaired.     ED Results / Procedures / Treatments   Labs (all labs ordered are listed, but only abnormal results are displayed) Labs Reviewed  CBC WITH DIFFERENTIAL/PLATELET - Abnormal; Notable for the following components:      Result Value   RBC 4.18 (*)  Hemoglobin 12.5 (*)    HCT 38.7 (*)    All other components within normal limits  COMPREHENSIVE METABOLIC PANEL - Abnormal; Notable for the following components:   Creatinine, Ser 1.28 (*)    GFR calc non Af Amer 58 (*)    All other components within normal limits  I-STAT CHEM 8, ED    EKG None  Radiology CT Cervical Spine Wo Contrast  Result Date: 08/29/2020 CLINICAL DATA:  Acute neck pain, known malignancy of the prostate. Denies fall or injury. EXAM: CT CERVICAL SPINE WITHOUT CONTRAST TECHNIQUE: Multidetector CT imaging of the cervical spine was performed without intravenous contrast. Multiplanar CT image reconstructions were also generated. COMPARISON:  Nuclear medicine bone scan 07/28/2018, CT head 11/20/2010. FINDINGS: Alignment: Reversal of the normal cervical lordosis centered at the C4-C5 level likely due to degenerative changes and positioning. Skull base and vertebrae: No acute fracture. Slight increased sclerosis of the cervical spine vertebral bodies with no primary bone lesion or definite focal pathologic process. Multilevel degenerative changes of the cervical spine including osteophyte formation, uncovertebral arthropathy, and Schmorl node formation that are worse at the  C4 through C6 levels. Degenerative change of the C1-C2 level also noted. No severe neural foraminal or central canal stenosis. Soft tissues and spinal canal: No prevertebral fluid or swelling. No visible canal hematoma. Disc levels: Multilevel at least moderate intervertebral disc space narrowing that is worse at the C4-C5, C5-C6, and C6-C7 levels. Upper chest: Negative. Other: None. IMPRESSION: No acute fracture or traumatic listhesis of the cervical spine. Multilevel at least moderate severe degenerative change of the spine worse at C1-C2 and C4-C6 levels. Electronically Signed   By: Iven Finn M.D.   On: 08/29/2020 10:58    Procedures Procedures (including critical care time)  Medications Ordered in ED Medications  diclofenac (FLECTOR) 1.3 % 1 patch (1 patch Transdermal Patch Applied 08/29/20 1109)  tiZANidine (ZANAFLEX) tablet 2 mg (2 mg Oral Given 08/29/20 1109)    ED Course  I have reviewed the triage vital signs and the nursing notes.  Pertinent labs & imaging results that were available during my care of the patient were reviewed by me and considered in my medical decision making (see chart for details).    12:45 PM Patient notes that he feels somewhat better.   Adult male with multiple medical issues including prostate cancer now presents with neck pain. Given his active malignancy, concern for lytic lesions, CT scan was performed, as well as labs. CT scan reassuring aside from degenerative changes, labs do not demonstrate substantial abnormality such as hypercalcemia suggesting decompensated state, nor leukocytosis, no evidence for infection, no evidence for new no oral compromise, patient appropriate for discharge with ongoing therapy.  Final Clinical Impression(s) / ED Diagnoses Final diagnoses:  Neck pain    Rx / DC Orders ED Discharge Orders         Ordered    Methyl Salicylate-Lido-Menthol 4-4-5 % PTCH  2 times daily        08/29/20 1240    tizanidine (ZANAFLEX) 2  MG capsule  3 times daily        08/29/20 1240           Carmin Muskrat, MD 08/29/20 1245

## 2020-08-29 NOTE — ED Notes (Signed)
Pt discharged from this facility back to [facility] via [place]. Report given to [facility] staff and [place] with no further questions. Pt discharged from this facility in stable condition at this time.

## 2021-11-29 IMAGING — CT CT CERVICAL SPINE W/O CM
5 series · 15 of 33 positions shown, 17 images · non-contrast
Comparison: Nuclear medicine bone scan 07/28/2018, CT head
11/20/2010.

CLINICAL DATA: Acute neck pain, known malignancy of the prostate.
Denies fall or injury.

EXAM:
CT CERVICAL SPINE WITHOUT CONTRAST
TECHNIQUE: Multidetector CT imaging of the cervical spine was performed without
intravenous contrast. Multiplanar CT image reconstructions were also
generated.

[Series 4: c spine soft · axial · 0.52mm/px · z∈[-274,-224]mm · 2 of 100 slices shown]
[im 25/100  soft-tissue]
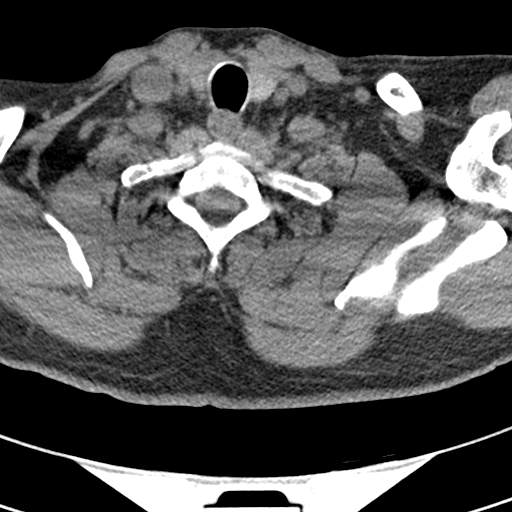
[im 50/100  soft-tissue]
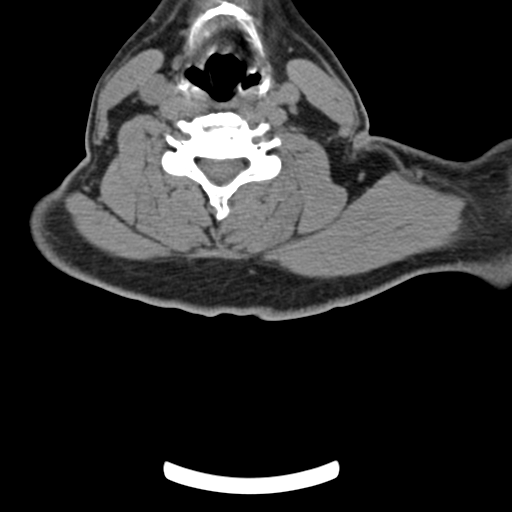

[Series 7: orthogonal bone · axial · 0.30mm/px · z∈[-265,-195]mm · 2 of 105 slices shown (1 of 2)]
[im 35/105  bone]
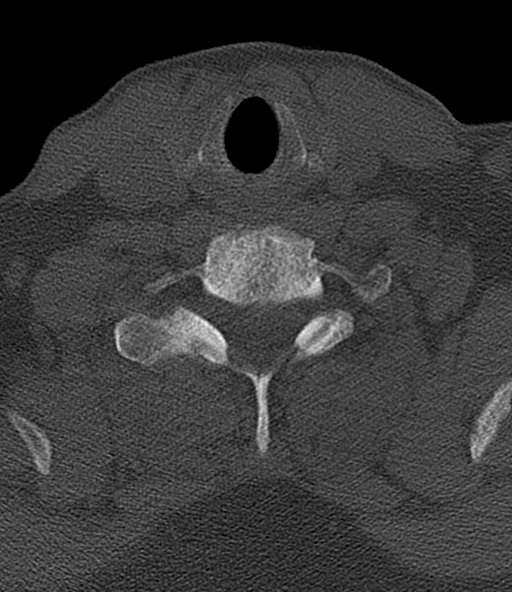
[im 70/105  bone]
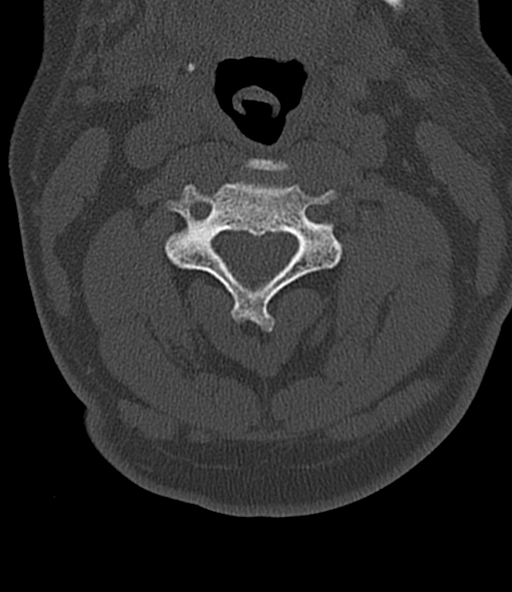

[Series 8: coronal bone · coronal · 0.29mm/px · 3 of 103 slices shown]
[im 21/103  bone]
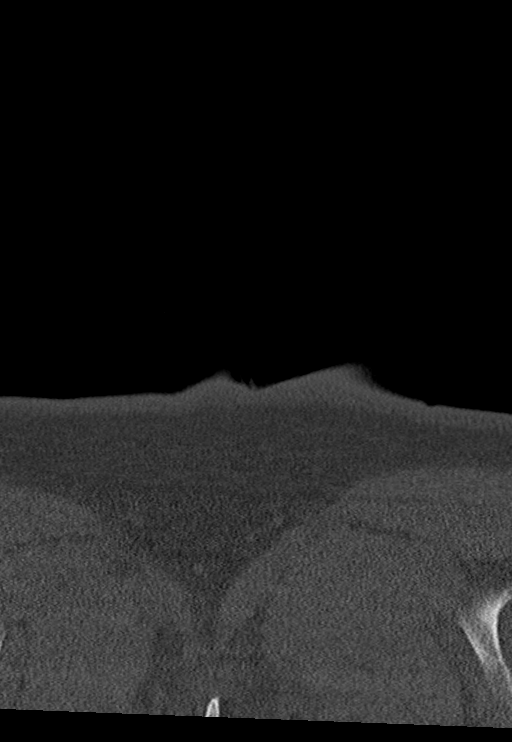
[im 41/103  bone]
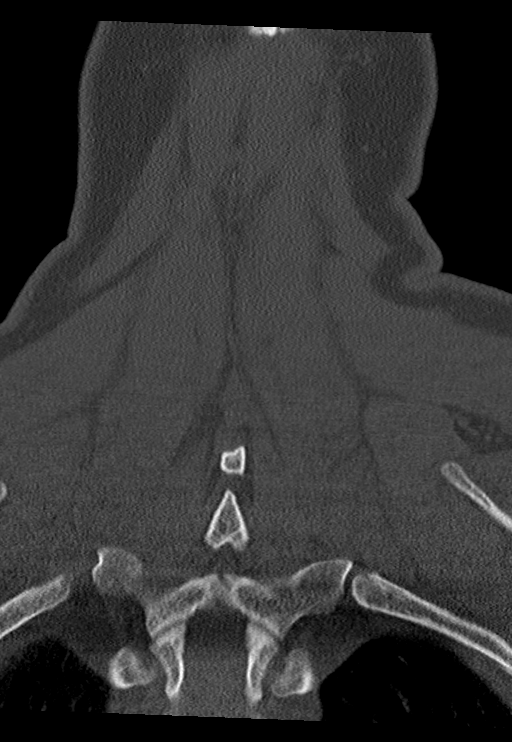
[im 62/103  bone]
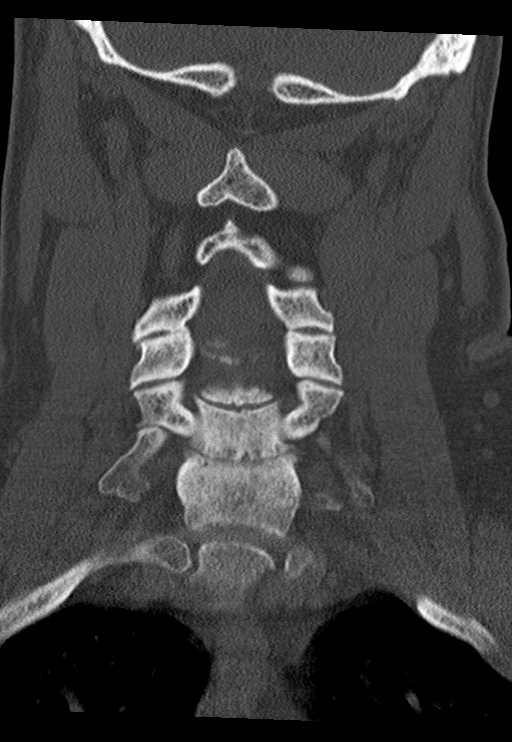

[Series 9: sagittal bone · sagittal · 0.42mm/px · 5 of 66 slices shown, 6 images]
[im 22/66  bone]
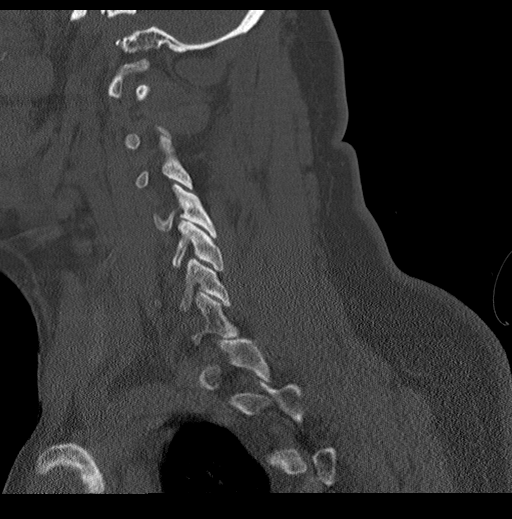
[im 28/66  bone]
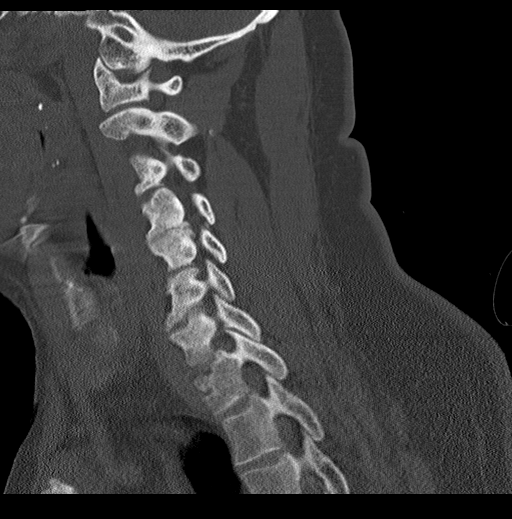
[im 33/66  soft-tissue]
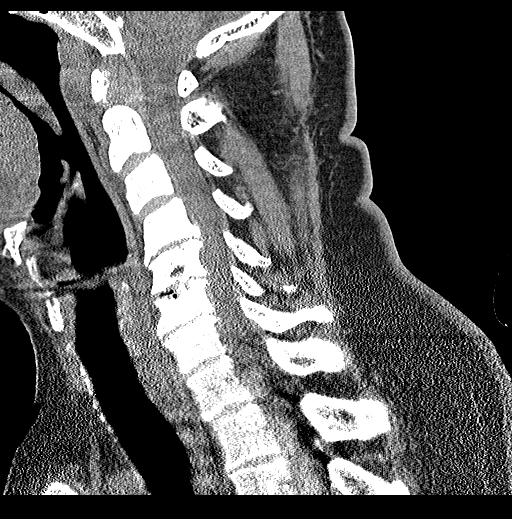
[im 33/66  bone]
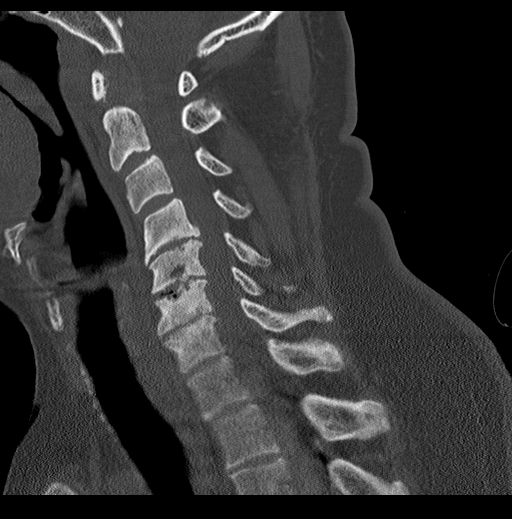
[im 38/66  bone]
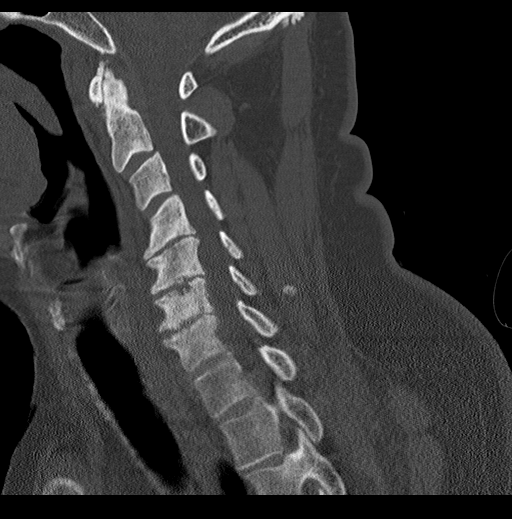
[im 44/66  bone]
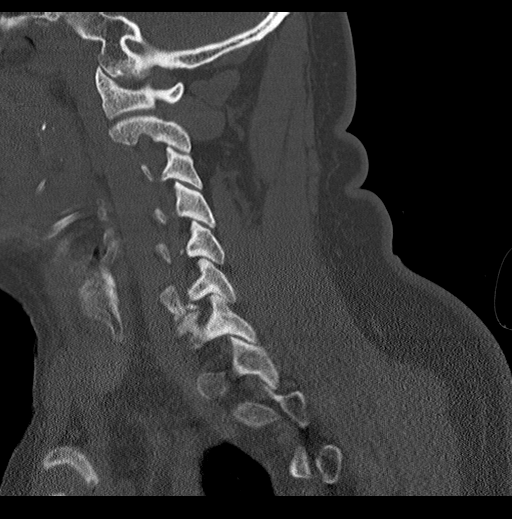

[Series 10: orthogonal bone · axial · 0.28mm/px · z∈[-310,-186]mm · 3 of 132 slices shown, 4 images (2 of 2)]
[im 33/132  soft-tissue]
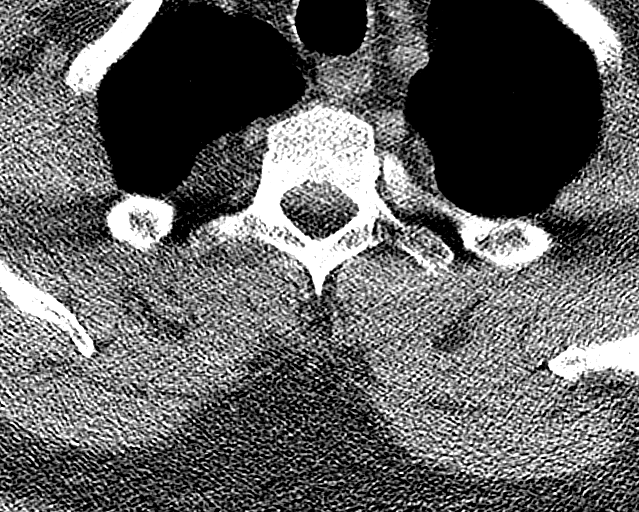
[im 33/132  bone]
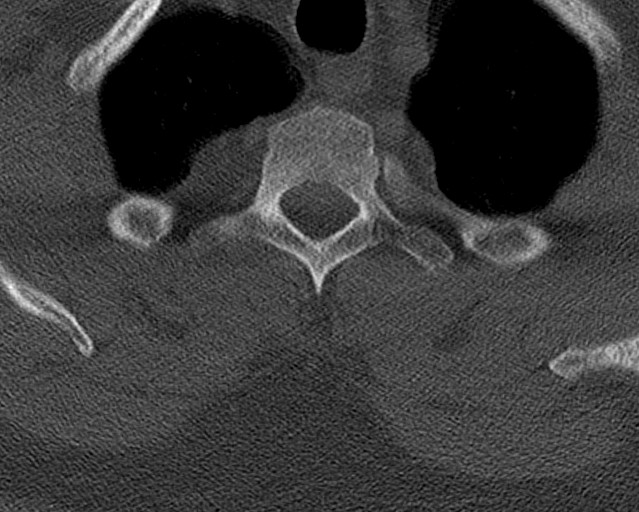
[im 66/132  bone]
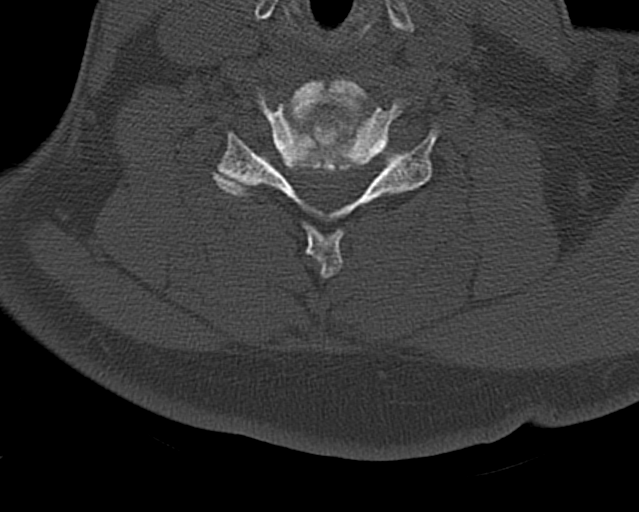
[im 99/132  bone]
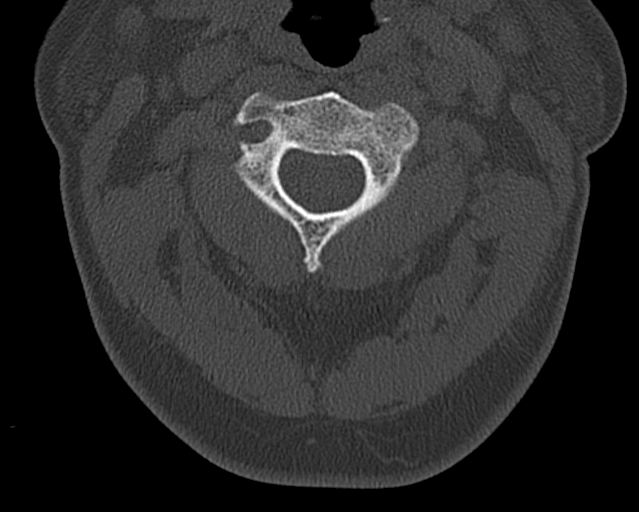

[15 of 33 positions shown; findings below may reference images not displayed]

FINDINGS: Alignment: Reversal of the normal cervical lordosis centered at the
C4-C5 level likely due to degenerative changes and positioning.

Skull base and vertebrae: No acute fracture. Slight increased
sclerosis of the cervical spine vertebral bodies with no primary
bone lesion or definite focal pathologic process. Multilevel
degenerative changes of the cervical spine including osteophyte
formation, uncovertebral arthropathy, and Schmorl node formation
that are worse at the C4 through C6 levels. Degenerative change of
the C1-C2 level also noted. No severe neural foraminal or central
canal stenosis.

Soft tissues and spinal canal: No prevertebral fluid or swelling. No
visible canal hematoma.

Disc levels: Multilevel at least moderate intervertebral disc space
narrowing that is worse at the C4-C5, C5-C6, and C6-C7 levels.

Upper chest: Negative.

Other: None.
IMPRESSION: No acute fracture or traumatic listhesis of the cervical spine.

Multilevel at least moderate severe degenerative change of the spine
worse at C1-C2 and C4-C6 levels.

## 2024-01-21 ENCOUNTER — Other Ambulatory Visit: Payer: Self-pay

## 2024-01-21 ENCOUNTER — Encounter: Payer: Self-pay | Admitting: Emergency Medicine

## 2024-01-21 ENCOUNTER — Emergency Department
Admission: EM | Admit: 2024-01-21 | Discharge: 2024-01-21 | Disposition: A | Discharge status: Home / Self Care | Payer: Medicare PPO | Attending: Emergency Medicine | Admitting: Emergency Medicine

## 2024-01-21 DIAGNOSIS — R112 Nausea with vomiting, unspecified: Secondary | ICD-10-CM | POA: Insufficient documentation

## 2024-01-21 DIAGNOSIS — I10 Essential (primary) hypertension: Secondary | ICD-10-CM | POA: Insufficient documentation

## 2024-01-21 DIAGNOSIS — R197 Diarrhea, unspecified: Secondary | ICD-10-CM | POA: Insufficient documentation

## 2024-01-21 DIAGNOSIS — E119 Type 2 diabetes mellitus without complications: Secondary | ICD-10-CM | POA: Diagnosis not present

## 2024-01-21 DIAGNOSIS — K921 Melena: Secondary | ICD-10-CM | POA: Insufficient documentation

## 2024-01-21 LAB — CBC
HCT: 53.2 % — ABNORMAL HIGH (ref 39.0–52.0)
Hemoglobin: 17.5 g/dL — ABNORMAL HIGH (ref 13.0–17.0)
MCH: 30.2 pg (ref 26.0–34.0)
MCHC: 32.9 g/dL (ref 30.0–36.0)
MCV: 91.9 fL (ref 80.0–100.0)
Platelets: 255 10*3/uL (ref 150–400)
RBC: 5.79 MIL/uL (ref 4.22–5.81)
RDW: 12.7 % (ref 11.5–15.5)
WBC: 6.4 10*3/uL (ref 4.0–10.5)
nRBC: 0 % (ref 0.0–0.2)

## 2024-01-21 LAB — URINALYSIS, ROUTINE W REFLEX MICROSCOPIC
Bacteria, UA: NONE SEEN
Bilirubin Urine: NEGATIVE
Glucose, UA: NEGATIVE mg/dL
Ketones, ur: 5 mg/dL — AB
Leukocytes,Ua: NEGATIVE
Nitrite: NEGATIVE
Protein, ur: 100 mg/dL — AB
Specific Gravity, Urine: 1.026 (ref 1.005–1.030)
pH: 5 (ref 5.0–8.0)

## 2024-01-21 LAB — COMPREHENSIVE METABOLIC PANEL
ALT: 39 U/L (ref 0–44)
AST: 36 U/L (ref 15–41)
Albumin: 3.9 g/dL (ref 3.5–5.0)
Alkaline Phosphatase: 79 U/L (ref 38–126)
Anion gap: 12 (ref 5–15)
BUN: 14 mg/dL (ref 8–23)
CO2: 19 mmol/L — ABNORMAL LOW (ref 22–32)
Calcium: 8.7 mg/dL — ABNORMAL LOW (ref 8.9–10.3)
Chloride: 105 mmol/L (ref 98–111)
Creatinine, Ser: 1.24 mg/dL (ref 0.61–1.24)
GFR, Estimated: >60 mL/min (ref >60)
Glucose, Bld: 85 mg/dL (ref 70–99)
Potassium: 3.3 mmol/L — ABNORMAL LOW (ref 3.5–5.1)
Sodium: 136 mmol/L (ref 135–145)
Total Bilirubin: 0.8 mg/dL (ref 0.0–1.2)
Total Protein: 8 g/dL (ref 6.5–8.1)

## 2024-01-21 LAB — LIPASE, BLOOD: Lipase: 23 U/L (ref 11–51)

## 2024-01-21 MED ORDER — ONDANSETRON 4 MG PO TBDP: 4.0000 mg | ORAL_TABLET | Freq: Three times a day (TID) | ORAL | 0 refills | Status: AC | PRN

## 2024-01-21 NOTE — ED Provider Notes (Signed)
[facility] Emergency Department Provider Note     None    (approximate)   History   Diarrhea   HPI  Victor Mcgee is a 73 y.o. male with a history of diabetes, HTN TN, HLD, and MS, presents to the ED for 2 days of diarrhea.  He would endorse similar symptoms in his household contacts.  He has been taking Pepto-Bismol limited benefit.  He does endorse dark stools at this time.  No ongoing nausea, vomiting, fevers, or abdominal pain at this time.  Physical Exam   Triage Vital Signs: ED Triage Vitals [01/21/24 1414]  Encounter Vitals Group     BP (!) 145/82     Systolic BP Percentile      Diastolic BP Percentile      Pulse Rate 100     Resp 16     Temp 98.4 F (36.9 C)     Temp Source Oral     SpO2 100 %     Weight 215 lb (97.5 kg)     Height 6\' 5"  (1.956 m)     Head Circumference      Peak Flow      Pain Score 0     Pain Loc      Pain Education      Exclude from Growth Chart     Most recent vital signs: Vitals:   01/21/24 1414  BP: (!) 145/82  Pulse: 100  Resp: 16  Temp: 98.4 F (36.9 C)  SpO2: 100%    General Awake, no distress. NAD HEENT NCAT. PERRL. EOMI. No rhinorrhea. Mucous membranes are moist.  CV:  Good peripheral perfusion. RRR RESP:  Normal effort. CTA ABD:  No distention.  Soft and nontender   ED Results / Procedures / Treatments   Labs (all labs ordered are listed, but only abnormal results are displayed) Labs Reviewed  COMPREHENSIVE METABOLIC PANEL - Abnormal; Notable for the following components:      Result Value   Potassium 3.3 (*)    CO2 19 (*)    Calcium 8.7 (*)    All other components within normal limits  CBC - Abnormal; Notable for the following components:   Hemoglobin 17.5 (*)    HCT 53.2 (*)    All other components within normal limits  URINALYSIS, ROUTINE W REFLEX MICROSCOPIC - Abnormal; Notable for the following components:   Color, Urine YELLOW (*)    APPearance HAZY (*)    Hgb urine  dipstick SMALL (*)    Ketones, ur 5 (*)    Protein, ur 100 (*)    All other components within normal limits  LIPASE, BLOOD     EKG   RADIOLOGY   PROCEDURES:  Critical Care performed: No  Procedures   MEDICATIONS ORDERED IN ED: Medications - No data to display   IMPRESSION / MDM / ASSESSMENT AND PLAN / ED COURSE  I reviewed the triage vital signs and the nursing notes.                              Differential diagnosis includes, but is not limited to, acute appendicitis, renal colic, testicular torsion, urinary tract infection/pyelonephritis, prostatitis,  epididymitis, diverticulitis, small bowel obstruction or ileus, colitis, abdominal aortic aneurysm, gastroenteritis, hernia, etc.   Patient's presentation is most consistent with acute complicated illness / injury requiring diagnostic workup.  Patient's diagnosis is consistent with viral gastroenteritis.  Patient with  intermittent but resolving episodes of nausea, vomiting, and diarrhea over the last 2 days.  He would endorse similar symptoms in his household contacts.  Patient presents to the ED in no acute distress with nontoxic appearance notable symptoms at this time.  Patient is not endorsing abdominal or chest pain.  Labs are reassuring without any acute leukocytosis or critical anemia.  Patient will be discharged home with prescriptions for Zofran. Patient is to follow up with his primary provider as discussed, as needed or otherwise directed. Patient is given ED precautions to return to the ED for any worsening or new symptoms.  Clinical Course as of 01/21/24 1631  Thu Jan 21, 2024  1624 MCHC: 32.9 [JM]    Clinical Course User Index [JM] Lesleyanne Politte, Charlesetta Ivory, PA-C    FINAL CLINICAL IMPRESSION(S) / ED DIAGNOSES   Final diagnoses:  Nausea vomiting and diarrhea     Rx / DC Orders   ED Discharge Orders          Ordered    ondansetron (ZOFRAN-ODT) 4 MG disintegrating tablet  Every 8 hours PRN         01/21/24 1628             Note:  This document was prepared using Dragon voice recognition software and may include unintentional dictation errors.    Lissa Hoard, PA-C 01/23/24 1103    Chesley Noon, MD 01/24/24 [phone]

## 2024-01-21 NOTE — ED Provider Triage Note (Signed)
Emergency Medicine Provider Triage Evaluation Note  Victor Mcgee, a 73 y.o. male  was evaluated in triage.  Pt complains of NVD.  Review of Systems  Positive: NVD Negative: FCS  Physical Exam  BP (!) 145/82 (BP Location: Left Arm)   Pulse 100   Temp 98.4 F (36.9 C) (Oral)   Resp 16   Ht 6\' 5"  (1.956 m)   Wt 97.5 kg   SpO2 100%   BMI 25.50 kg/m  Gen:   Awake, no distress  NAD Resp:  Normal effort CTA MSK:   Moves extremities without difficulty  Other:    Medical Decision Making  Medically screening exam initiated at 4:22 PM.  Appropriate orders placed.  Laron Boorman was informed that the remainder of the evaluation will be completed by another provider, this initial triage assessment does not replace that evaluation, and the importance of remaining in the ED until their evaluation is complete.  Geriatric patient to the ED for evaluation of nausea, vomiting, and.  He notes similar symptoms in his household contacts.   Lissa Hoard, PA-C 01/23/24 1115

## 2024-01-21 NOTE — ED Triage Notes (Signed)
Pt to ED for 2 days of diarrhea. Pt denies any other symptoms. Pt states that he took Pepto Bismol but it did not help with the diarrhea.

## 2024-01-21 NOTE — Discharge Instructions (Addendum)
Your exam and labs are normal and reassuring. Take the prescription nausea medicine as needed. Note that the Pepto-Bismol will turn your stools dark in color. Consider OTC Imodium for ongoing diarrhea. Drink water, electrolyte water, and sports drinks to reduce dehydration. See your PCP for continued symptoms.

## 2024-02-05 ENCOUNTER — Other Ambulatory Visit: Payer: Self-pay

## 2024-02-05 ENCOUNTER — Emergency Department
Admission: EM | Admit: 2024-02-05 | Discharge: 2024-02-05 | Disposition: A | Discharge status: Home / Self Care | Payer: Medicare PPO | Attending: Emergency Medicine | Admitting: Emergency Medicine

## 2024-02-05 ENCOUNTER — Emergency Department: Payer: Medicare PPO

## 2024-02-05 DIAGNOSIS — E119 Type 2 diabetes mellitus without complications: Secondary | ICD-10-CM | POA: Insufficient documentation

## 2024-02-05 DIAGNOSIS — I10 Essential (primary) hypertension: Secondary | ICD-10-CM | POA: Diagnosis not present

## 2024-02-05 DIAGNOSIS — M25422 Effusion, left elbow: Secondary | ICD-10-CM | POA: Diagnosis not present

## 2024-02-05 DIAGNOSIS — M25522 Pain in left elbow: Secondary | ICD-10-CM | POA: Diagnosis present

## 2024-02-05 LAB — CBC WITH DIFFERENTIAL/PLATELET
Abs Immature Granulocytes: 0.03 10*3/uL (ref 0.00–0.07)
Basophils Absolute: 0 10*3/uL (ref 0.0–0.1)
Basophils Relative: 0 %
Eosinophils Absolute: 0.1 10*3/uL (ref 0.0–0.5)
Eosinophils Relative: 1 %
HCT: 49.3 % (ref 39.0–52.0)
Hemoglobin: 15.9 g/dL (ref 13.0–17.0)
Immature Granulocytes: 0 %
Lymphocytes Relative: 16 %
Lymphs Abs: 1.4 10*3/uL (ref 0.7–4.0)
MCH: 29.6 pg (ref 26.0–34.0)
MCHC: 32.3 g/dL (ref 30.0–36.0)
MCV: 91.8 fL (ref 80.0–100.0)
Monocytes Absolute: 0.7 10*3/uL (ref 0.1–1.0)
Monocytes Relative: 8 %
Neutro Abs: 6.6 10*3/uL (ref 1.7–7.7)
Neutrophils Relative %: 75 %
Platelets: 270 10*3/uL (ref 150–400)
RBC: 5.37 MIL/uL (ref 4.22–5.81)
RDW: 12.7 % (ref 11.5–15.5)
WBC: 8.8 10*3/uL (ref 4.0–10.5)
nRBC: 0 % (ref 0.0–0.2)

## 2024-02-05 LAB — COMPREHENSIVE METABOLIC PANEL
ALT: 25 U/L (ref 0–44)
AST: 23 U/L (ref 15–41)
Albumin: 4 g/dL (ref 3.5–5.0)
Alkaline Phosphatase: 77 U/L (ref 38–126)
Anion gap: 10 (ref 5–15)
BUN: 14 mg/dL (ref 8–23)
CO2: 26 mmol/L (ref 22–32)
Calcium: 9.2 mg/dL (ref 8.9–10.3)
Chloride: 102 mmol/L (ref 98–111)
Creatinine, Ser: 1.35 mg/dL — ABNORMAL HIGH (ref 0.61–1.24)
GFR, Estimated: 56 mL/min — ABNORMAL LOW (ref >60)
Glucose, Bld: 111 mg/dL — ABNORMAL HIGH (ref 70–99)
Potassium: 4.1 mmol/L (ref 3.5–5.1)
Sodium: 138 mmol/L (ref 135–145)
Total Bilirubin: 1.3 mg/dL — ABNORMAL HIGH (ref 0.0–1.2)
Total Protein: 8.2 g/dL — ABNORMAL HIGH (ref 6.5–8.1)

## 2024-02-05 LAB — URIC ACID: Uric Acid, Serum: 6.9 mg/dL (ref 3.7–8.6)

## 2024-02-05 LAB — C-REACTIVE PROTEIN: CRP: 11 mg/dL — ABNORMAL HIGH (ref <1.0)

## 2024-02-05 LAB — SEDIMENTATION RATE: Sed Rate: 5 mm/h (ref 0–20)

## 2024-02-05 NOTE — ED Triage Notes (Addendum)
Pt c/o left arm pain from mid upper to mid lower arm that limits movement due to pain. CMS intact, pt denies numbness and tingling. Pt denies injury to extremity. Pt is AOX4, NAD noted. No obvious redness or swelling noted.

## 2024-02-05 NOTE — ED Provider Triage Note (Signed)
Emergency Medicine Provider Triage Evaluation Note  Victor Mcgee , a 73 y.o. male  was evaluated in triage.  Pt complains of few days of left elbow pain and limited range of motion.  No injury.  No history of similar.  No history of VTE.Marland Kitchen  Review of Systems  Positive:  Negative:   Physical Exam  BP 125/82 (BP Location: Right Arm)   Pulse 92   Temp 98.6 F (37 C) (Oral)   Resp 18   SpO2 100%  Gen:   Awake, no distress   Resp:  Normal effort  MSK:   Moves extremities without difficulty although mild limited extension left elbow and moderate warmth.  NVI Other:    Medical Decision Making  Medically screening exam initiated at 12:31 PM.  Appropriate orders placed.  Victor Mcgee was informed that the remainder of the evaluation will be completed by another provider, this initial triage assessment does not replace that evaluation, and the importance of remaining in the ED until their evaluation is complete.  Left elbow pain limited extension and some joint warmth.  NVI.  Labs and x-ray   Christen Bame, [place] 02/05/24 1232

## 2024-02-05 NOTE — ED Provider Notes (Signed)
[facility] Provider Note    Event Date/Time   First MD Initiated Contact with Patient 02/05/24 1356     (approximate)   History   No chief complaint on file.   HPI  Tryton Bodi is a 73 y.o. male  with history of MS, incomplete paraplegia, DM 2, HTN and as listed in EMR presents to the emergency department for gradually worsening left elbow pain and decreased ability to fully flex or extend. He denies weakness, radiculopathy, or new injury. He denies history of gout.     Physical Exam   Triage Vital Signs: ED Triage Vitals  Encounter Vitals Group     BP 02/05/24 1216 125/82     Systolic BP Percentile --      Diastolic BP Percentile --      Pulse Rate 02/05/24 1216 92     Resp 02/05/24 1216 18     Temp 02/05/24 1216 98.6 F (37 C)     Temp Source 02/05/24 1216 Oral     SpO2 02/05/24 1216 100 %     Weight 02/05/24 1407 214 lb 15.2 oz (97.5 kg)     Height 02/05/24 1407 6\' 5"  (1.956 m)     Head Circumference --      Peak Flow --      Pain Score 02/05/24 1234 7     Pain Loc --      Pain Education --      Exclude from Growth Chart --     Most recent vital signs: Vitals:   02/05/24 1216  BP: 125/82  Pulse: 92  Resp: 18  Temp: 98.6 F (37 C)  SpO2: 100%    General: Awake, no distress.  CV:  Good peripheral perfusion.  Resp:  Normal effort.  Abd:  No distention.  Other:  Restricted flexion and extension of the left elbow. No effusion, redness, wound. Skin temp is normal.    ED Results / Procedures / Treatments   Labs (all labs ordered are listed, but only abnormal results are displayed) Labs Reviewed  C-REACTIVE PROTEIN - Abnormal; Notable for the following components:      Result Value   CRP 11.0 (*)    All other components within normal limits  COMPREHENSIVE METABOLIC PANEL - Abnormal; Notable for the following components:   Glucose, Bld 111 (*)    Creatinine, Ser 1.35 (*)    Total Protein 8.2 (*)    Total Bilirubin 1.3 (*)     GFR, Estimated 56 (*)    All other components within normal limits  SEDIMENTATION RATE  URIC ACID  CBC WITH DIFFERENTIAL/PLATELET     EKG  Not indicated.   RADIOLOGY  Image and radiology report reviewed and interpreted by me. Radiology report consistent with the same.  X-ray of the left elbow shows trace joint effusion, mild swelling of soft tissues, and degenerative changes.  PROCEDURES:  Critical Care performed: No  Procedures   MEDICATIONS ORDERED IN ED:  Medications - No data to display   IMPRESSION / MDM / ASSESSMENT AND PLAN / ED COURSE   I have reviewed the triage note.  Differential diagnosis includes, but is not limited to,muscle strain, bursitis, arthritis, MS progression  Patient's presentation is most consistent with acute illness / injury with system symptoms.  73 year old male presents to the ER for evaluation of non-traumatic left elbow pain x 3 days.  He has MS but denies any new weakness.  Grip strength is equal.  Labs are reassuring.  X-ray shows a trace joint effusion with some swelling and degenerative changes.  Results discussed with the patient and family.  Patient reports that he sits for long periods of time in a worn armchair and typically leans on his left elbow.  This is likely the cause of his symptoms.  He was encouraged to avoid leaning on the elbow, take Tylenol arthritis and follow-up with orthopedics.  If he develops any new weakness or other concerns and is unable to see his primary care provider or neurologist he is to return to the emergency department.      FINAL CLINICAL IMPRESSION(S) / ED DIAGNOSES   Final diagnoses:  Joint effusion of elbow, left     Rx / DC Orders   ED Discharge Orders     None        Note:  This document was prepared using Dragon voice recognition software and may include unintentional dictation errors.   Chinita Pester, FNP 02/07/24 1526    Jene Every, MD 02/08/24 [phone]

## 2024-02-05 NOTE — Discharge Instructions (Addendum)
Please follow up with orthopedics if not improving over the week.  As discussed, try to avoid putting pressure on your elbow for long periods of time.  Tylenol Arthritis may help.

## 2024-02-05 NOTE — ED Notes (Signed)
See triage note  Presents left arm pain. pain is mainly to elbow area Denies any injury Pain with movement
